# Patient Record
Sex: Female | Born: 1990 | Race: White | Hispanic: No | Marital: Single | Smoking: Former smoker
Health system: Southern US, Community
[De-identification: ages and names within clinical notes are randomized; demographics above are authoritative.]

## PROBLEM LIST (undated history)

## (undated) DIAGNOSIS — R51 Headache: Secondary | ICD-10-CM

## (undated) DIAGNOSIS — F419 Anxiety disorder, unspecified: Secondary | ICD-10-CM

## (undated) DIAGNOSIS — F32A Depression, unspecified: Secondary | ICD-10-CM

## (undated) DIAGNOSIS — N83209 Unspecified ovarian cyst, unspecified side: Secondary | ICD-10-CM

## (undated) DIAGNOSIS — F329 Major depressive disorder, single episode, unspecified: Secondary | ICD-10-CM

## (undated) DIAGNOSIS — R519 Headache, unspecified: Secondary | ICD-10-CM

## (undated) HISTORY — DX: Depression, unspecified: F32.A

## (undated) HISTORY — DX: Major depressive disorder, single episode, unspecified: F32.9

## (undated) HISTORY — PX: LEG SURGERY: SHX1003

---

## 2012-02-19 DIAGNOSIS — L500 Allergic urticaria: Secondary | ICD-10-CM

## 2014-03-04 ENCOUNTER — Emergency Department (HOSPITAL_COMMUNITY): Payer: BC Managed Care – PPO

## 2014-03-04 ENCOUNTER — Encounter (HOSPITAL_COMMUNITY): Payer: Self-pay | Admitting: Radiology

## 2014-03-04 ENCOUNTER — Inpatient Hospital Stay (HOSPITAL_COMMUNITY)
Admission: EM | Admit: 2014-03-04 | Discharge: 2014-03-08 | DRG: 958 | Disposition: A | Discharge status: Rehabilitation Facility | Payer: BC Managed Care – PPO | Attending: Orthopedic Surgery | Admitting: Orthopedic Surgery

## 2014-03-04 ENCOUNTER — Emergency Department (HOSPITAL_COMMUNITY): Payer: BC Managed Care – PPO | Admitting: Anesthesiology

## 2014-03-04 ENCOUNTER — Encounter (HOSPITAL_COMMUNITY): Admission: EM | Disposition: A | Discharge status: Rehabilitation Facility | Payer: Self-pay | Source: Home / Self Care

## 2014-03-04 ENCOUNTER — Encounter (HOSPITAL_COMMUNITY): Payer: BC Managed Care – PPO | Admitting: Anesthesiology

## 2014-03-04 DIAGNOSIS — R209 Unspecified disturbances of skin sensation: Secondary | ICD-10-CM | POA: Diagnosis present

## 2014-03-04 DIAGNOSIS — S71109A Unspecified open wound, unspecified thigh, initial encounter: Secondary | ICD-10-CM | POA: Diagnosis present

## 2014-03-04 DIAGNOSIS — S81809A Unspecified open wound, unspecified lower leg, initial encounter: Secondary | ICD-10-CM

## 2014-03-04 DIAGNOSIS — R339 Retention of urine, unspecified: Secondary | ICD-10-CM | POA: Diagnosis not present

## 2014-03-04 DIAGNOSIS — S81009A Unspecified open wound, unspecified knee, initial encounter: Principal | ICD-10-CM | POA: Diagnosis present

## 2014-03-04 DIAGNOSIS — S060X9A Concussion with loss of consciousness of unspecified duration, initial encounter: Secondary | ICD-10-CM

## 2014-03-04 DIAGNOSIS — S21302A Unspecified open wound of left front wall of thorax with penetration into thoracic cavity, initial encounter: Secondary | ICD-10-CM

## 2014-03-04 DIAGNOSIS — S27329A Contusion of lung, unspecified, initial encounter: Secondary | ICD-10-CM | POA: Diagnosis present

## 2014-03-04 DIAGNOSIS — S3210XA Unspecified fracture of sacrum, initial encounter for closed fracture: Secondary | ICD-10-CM

## 2014-03-04 DIAGNOSIS — S71112A Laceration without foreign body, left thigh, initial encounter: Secondary | ICD-10-CM

## 2014-03-04 DIAGNOSIS — R110 Nausea: Secondary | ICD-10-CM | POA: Diagnosis not present

## 2014-03-04 DIAGNOSIS — S32599A Other specified fracture of unspecified pubis, initial encounter for closed fracture: Secondary | ICD-10-CM

## 2014-03-04 DIAGNOSIS — Z77098 Contact with and (suspected) exposure to other hazardous, chiefly nonmedicinal, chemicals: Secondary | ICD-10-CM | POA: Diagnosis present

## 2014-03-04 DIAGNOSIS — S329XXA Fracture of unspecified parts of lumbosacral spine and pelvis, initial encounter for closed fracture: Secondary | ICD-10-CM

## 2014-03-04 DIAGNOSIS — S322XXA Fracture of coccyx, initial encounter for closed fracture: Secondary | ICD-10-CM

## 2014-03-04 DIAGNOSIS — S060XAA Concussion with loss of consciousness status unknown, initial encounter: Secondary | ICD-10-CM | POA: Diagnosis present

## 2014-03-04 DIAGNOSIS — F1010 Alcohol abuse, uncomplicated: Secondary | ICD-10-CM | POA: Diagnosis present

## 2014-03-04 DIAGNOSIS — S32509A Unspecified fracture of unspecified pubis, initial encounter for closed fracture: Secondary | ICD-10-CM | POA: Diagnosis present

## 2014-03-04 DIAGNOSIS — S91009A Unspecified open wound, unspecified ankle, initial encounter: Principal | ICD-10-CM

## 2014-03-04 DIAGNOSIS — F17200 Nicotine dependence, unspecified, uncomplicated: Secondary | ICD-10-CM | POA: Diagnosis present

## 2014-03-04 DIAGNOSIS — S71012A Laceration without foreign body, left hip, initial encounter: Secondary | ICD-10-CM

## 2014-03-04 DIAGNOSIS — S71009A Unspecified open wound, unspecified hip, initial encounter: Secondary | ICD-10-CM | POA: Diagnosis present

## 2014-03-04 DIAGNOSIS — F10929 Alcohol use, unspecified with intoxication, unspecified: Secondary | ICD-10-CM | POA: Diagnosis present

## 2014-03-04 DIAGNOSIS — S81812A Laceration without foreign body, left lower leg, initial encounter: Secondary | ICD-10-CM

## 2014-03-04 DIAGNOSIS — S32009A Unspecified fracture of unspecified lumbar vertebra, initial encounter for closed fracture: Secondary | ICD-10-CM

## 2014-03-04 HISTORY — PX: I & D EXTREMITY: SHX5045

## 2014-03-04 LAB — COMPREHENSIVE METABOLIC PANEL
ALT: 28 U/L (ref 0–35)
AST: 46 U/L — ABNORMAL HIGH (ref 0–37)
Albumin: 4 g/dL (ref 3.5–5.2)
Alkaline Phosphatase: 74 U/L (ref 39–117)
BUN: 15 mg/dL (ref 6–23)
CALCIUM: 9.7 mg/dL (ref 8.4–10.5)
CO2: 21 meq/L (ref 19–32)
Chloride: 103 mEq/L (ref 96–112)
Creatinine, Ser: 0.76 mg/dL (ref 0.50–1.10)
GFR calc Af Amer: >90 mL/min (ref >90)
GLUCOSE: 99 mg/dL (ref 70–99)
Potassium: 3.7 mEq/L (ref 3.7–5.3)
Sodium: 141 mEq/L (ref 137–147)
TOTAL PROTEIN: 7.2 g/dL (ref 6.0–8.3)
Total Bilirubin: 0.4 mg/dL (ref 0.3–1.2)

## 2014-03-04 LAB — CBC
HCT: 37.6 % (ref 36.0–46.0)
HEMATOCRIT: 40.8 % (ref 36.0–46.0)
HEMOGLOBIN: 12.9 g/dL (ref 12.0–15.0)
Hemoglobin: 13.9 g/dL (ref 12.0–15.0)
MCH: 31 pg (ref 26.0–34.0)
MCH: 31.5 pg (ref 26.0–34.0)
MCHC: 34.1 g/dL (ref 30.0–36.0)
MCHC: 34.3 g/dL (ref 30.0–36.0)
MCV: 90.9 fL (ref 78.0–100.0)
MCV: 91.9 fL (ref 78.0–100.0)
PLATELETS: 200 10*3/uL (ref 150–400)
Platelets: 259 10*3/uL (ref 150–400)
RBC: 4.49 MIL/uL (ref 3.87–5.11)
RBC: 4.09 MIL/uL (ref 3.87–5.11)
RDW: 12.8 % (ref 11.5–15.5)
RDW: 13.1 % (ref 11.5–15.5)
WBC: 20 10*3/uL — AB (ref 4.0–10.5)
WBC: 11.8 10*3/uL — ABNORMAL HIGH (ref 4.0–10.5)

## 2014-03-04 LAB — I-STAT CHEM 8, ED
BUN: 16 mg/dL (ref 6–23)
CALCIUM ION: 1.26 mmol/L — AB (ref 1.12–1.23)
CHLORIDE: 105 meq/L (ref 96–112)
CREATININE: 0.9 mg/dL (ref 0.50–1.10)
Glucose, Bld: 98 mg/dL (ref 70–99)
HEMATOCRIT: 44 % (ref 36.0–46.0)
Hemoglobin: 15 g/dL (ref 12.0–15.0)
POTASSIUM: 3.6 meq/L — AB (ref 3.7–5.3)
Sodium: 142 mEq/L (ref 137–147)
TCO2: 20 mmol/L (ref 0–100)

## 2014-03-04 LAB — URINALYSIS, ROUTINE W REFLEX MICROSCOPIC
BILIRUBIN URINE: NEGATIVE
GLUCOSE, UA: NEGATIVE mg/dL
Ketones, ur: NEGATIVE mg/dL
Leukocytes, UA: NEGATIVE
NITRITE: NEGATIVE
Protein, ur: NEGATIVE mg/dL
SPECIFIC GRAVITY, URINE: 1.035 — AB (ref 1.005–1.030)
Urobilinogen, UA: 0.2 mg/dL (ref 0.0–1.0)
pH: 6 (ref 5.0–8.0)

## 2014-03-04 LAB — SAMPLE TO BLOOD BANK

## 2014-03-04 LAB — CDS SEROLOGY

## 2014-03-04 LAB — URINE MICROSCOPIC-ADD ON

## 2014-03-04 LAB — CREATININE, SERUM
Creatinine, Ser: 0.56 mg/dL (ref 0.50–1.10)
GFR calc Af Amer: >90 mL/min (ref >90)

## 2014-03-04 LAB — PROTIME-INR
INR: 0.89 (ref 0.00–1.49)
Prothrombin Time: 11.9 seconds (ref 11.6–15.2)

## 2014-03-04 LAB — I-STAT CG4 LACTIC ACID, ED: Lactic Acid, Venous: 2.64 mmol/L — ABNORMAL HIGH (ref 0.5–2.2)

## 2014-03-04 LAB — ETHANOL: ALCOHOL ETHYL (B): 79 mg/dL — AB (ref 0–11)

## 2014-03-04 LAB — GENTAMICIN LEVEL, RANDOM: Gentamicin Rm: 1.6 ug/mL

## 2014-03-04 SURGERY — IRRIGATION AND DEBRIDEMENT EXTREMITY
Anesthesia: General | Site: Leg Upper | Laterality: Left

## 2014-03-04 MED ORDER — CEFAZOLIN SODIUM-DEXTROSE 2-3 GM-% IV SOLR
2.0000 g | Freq: Three times a day (TID) | INTRAVENOUS | Status: AC
  Administered 2014-03-04 – 2014-03-05 (×6): 2 g via INTRAVENOUS
  Filled 2014-03-04 (×5): qty 50

## 2014-03-04 MED ORDER — METOCLOPRAMIDE HCL 5 MG/ML IJ SOLN
5.0000 mg | Freq: Three times a day (TID) | INTRAMUSCULAR | Status: DC | PRN
  Administered 2014-03-06: 10 mg via INTRAVENOUS
  Filled 2014-03-04: qty 2

## 2014-03-04 MED ORDER — ONDANSETRON HCL 4 MG/2ML IJ SOLN
4.0000 mg | Freq: Four times a day (QID) | INTRAMUSCULAR | Status: DC | PRN
Start: 2014-03-04 — End: 2014-03-06
  Administered 2014-03-06: 4 mg via INTRAVENOUS
  Filled 2014-03-04: qty 2

## 2014-03-04 MED ORDER — PHENYLEPHRINE 40 MCG/ML (10ML) SYRINGE FOR IV PUSH (FOR BLOOD PRESSURE SUPPORT)
PREFILLED_SYRINGE | INTRAVENOUS | Status: AC
  Filled 2014-03-04: qty 10

## 2014-03-04 MED ORDER — CEFAZOLIN SODIUM-DEXTROSE 2-3 GM-% IV SOLR
2.0000 g | Freq: Once | INTRAVENOUS | Status: AC
  Administered 2014-03-04: 2 g via INTRAVENOUS

## 2014-03-04 MED ORDER — CEFAZOLIN SODIUM-DEXTROSE 2-3 GM-% IV SOLR
INTRAVENOUS | Status: AC
  Filled 2014-03-04: qty 50

## 2014-03-04 MED ORDER — GENTAMICIN SULFATE 40 MG/ML IJ SOLN
INTRAMUSCULAR | Status: DC | PRN
  Administered 2014-03-04: 160 mg

## 2014-03-04 MED ORDER — HYDROMORPHONE HCL PF 1 MG/ML IJ SOLN
INTRAMUSCULAR | Status: AC
  Administered 2014-03-04: 0.5 mg via INTRAVENOUS
  Filled 2014-03-04: qty 1

## 2014-03-04 MED ORDER — ACETAMINOPHEN 500 MG PO TABS
500.0000 mg | ORAL_TABLET | Freq: Four times a day (QID) | ORAL | Status: DC | PRN
  Administered 2014-03-04 – 2014-03-07 (×6): 500 mg via ORAL
  Filled 2014-03-04 (×6): qty 1

## 2014-03-04 MED ORDER — HYDROMORPHONE HCL PF 1 MG/ML IJ SOLN
0.2500 mg | INTRAMUSCULAR | Status: DC | PRN
  Administered 2014-03-04 (×2): 0.5 mg via INTRAVENOUS

## 2014-03-04 MED ORDER — SUCCINYLCHOLINE CHLORIDE 20 MG/ML IJ SOLN
INTRAMUSCULAR | Status: AC
  Filled 2014-03-04: qty 1

## 2014-03-04 MED ORDER — ONDANSETRON HCL 4 MG/2ML IJ SOLN
4.0000 mg | Freq: Once | INTRAMUSCULAR | Status: AC
  Administered 2014-03-04: 4 mg via INTRAVENOUS
  Filled 2014-03-04: qty 2

## 2014-03-04 MED ORDER — LACTATED RINGERS IV SOLN
INTRAVENOUS | Status: DC | PRN
  Administered 2014-03-04 (×3): via INTRAVENOUS

## 2014-03-04 MED ORDER — OXYCODONE HCL 5 MG/5ML PO SOLN: 5.0000 mg | Freq: Once | ORAL | Status: DC | PRN

## 2014-03-04 MED ORDER — ONDANSETRON HCL 4 MG/2ML IJ SOLN: 4.0000 mg | Freq: Once | INTRAMUSCULAR | Status: DC | PRN

## 2014-03-04 MED ORDER — MORPHINE SULFATE 2 MG/ML IJ SOLN
1.0000 mg | INTRAMUSCULAR | Status: DC | PRN
  Administered 2014-03-04 – 2014-03-05 (×5): 1 mg via INTRAVENOUS
  Filled 2014-03-04 (×5): qty 1

## 2014-03-04 MED ORDER — SODIUM CHLORIDE 0.9 % IV SOLN
1000.0000 mL | Freq: Once | INTRAVENOUS | Status: AC
  Administered 2014-03-04: 1000 mL via INTRAVENOUS

## 2014-03-04 MED ORDER — GENTAMICIN SULFATE 40 MG/ML IJ SOLN
INTRAMUSCULAR | Status: AC
  Filled 2014-03-04: qty 4

## 2014-03-04 MED ORDER — MIDAZOLAM HCL 5 MG/5ML IJ SOLN
INTRAMUSCULAR | Status: DC | PRN
  Administered 2014-03-04: 2 mg via INTRAVENOUS

## 2014-03-04 MED ORDER — LIDOCAINE HCL (CARDIAC) 20 MG/ML IV SOLN
INTRAVENOUS | Status: AC
  Filled 2014-03-04: qty 5

## 2014-03-04 MED ORDER — FENTANYL CITRATE 0.05 MG/ML IJ SOLN
INTRAMUSCULAR | Status: DC | PRN
  Administered 2014-03-04: 50 ug via INTRAVENOUS
  Administered 2014-03-04 (×2): 25 ug via INTRAVENOUS
  Administered 2014-03-04 (×6): 50 ug via INTRAVENOUS

## 2014-03-04 MED ORDER — PROPOFOL 10 MG/ML IV BOLUS
INTRAVENOUS | Status: DC | PRN
  Administered 2014-03-04: 200 mg via INTRAVENOUS

## 2014-03-04 MED ORDER — SODIUM CHLORIDE 0.9 % IR SOLN
Status: DC | PRN
Start: 2014-03-04 — End: 2014-03-04
  Administered 2014-03-04: 6000 mL
  Administered 2014-03-04: 3000 mL

## 2014-03-04 MED ORDER — OXYCODONE HCL 5 MG PO TABS
5.0000 mg | ORAL_TABLET | ORAL | Status: DC | PRN
  Administered 2014-03-04 – 2014-03-05 (×3): 10 mg via ORAL
  Filled 2014-03-04 (×3): qty 2

## 2014-03-04 MED ORDER — VANCOMYCIN HCL 500 MG IV SOLR
INTRAVENOUS | Status: AC
Start: 2014-03-04 — End: 2014-03-04
  Filled 2014-03-04: qty 500

## 2014-03-04 MED ORDER — ENOXAPARIN SODIUM 40 MG/0.4ML ~~LOC~~ SOLN
40.0000 mg | SUBCUTANEOUS | Status: DC
  Administered 2014-03-05: 40 mg via SUBCUTANEOUS
  Filled 2014-03-04 (×2): qty 0.4

## 2014-03-04 MED ORDER — LIDOCAINE HCL (CARDIAC) 20 MG/ML IV SOLN
INTRAVENOUS | Status: DC | PRN
  Administered 2014-03-04: 80 mg via INTRAVENOUS

## 2014-03-04 MED ORDER — MIDAZOLAM HCL 2 MG/2ML IJ SOLN
INTRAMUSCULAR | Status: AC
  Filled 2014-03-04: qty 2

## 2014-03-04 MED ORDER — PROPOFOL 10 MG/ML IV BOLUS
INTRAVENOUS | Status: AC
  Filled 2014-03-04: qty 20

## 2014-03-04 MED ORDER — VANCOMYCIN HCL 500 MG IV SOLR
INTRAVENOUS | Status: DC | PRN
  Administered 2014-03-04: 500 mg

## 2014-03-04 MED ORDER — FENTANYL CITRATE 0.05 MG/ML IJ SOLN
INTRAMUSCULAR | Status: AC
  Filled 2014-03-04: qty 5

## 2014-03-04 MED ORDER — METOCLOPRAMIDE HCL 10 MG PO TABS
5.0000 mg | ORAL_TABLET | Freq: Three times a day (TID) | ORAL | Status: DC | PRN
  Administered 2014-03-05: 10 mg via ORAL
  Filled 2014-03-04: qty 1

## 2014-03-04 MED ORDER — ARTIFICIAL TEARS OP OINT
TOPICAL_OINTMENT | OPHTHALMIC | Status: AC
  Filled 2014-03-04: qty 3.5

## 2014-03-04 MED ORDER — MORPHINE SULFATE 4 MG/ML IJ SOLN
4.0000 mg | INTRAMUSCULAR | Status: DC | PRN
  Administered 2014-03-04: 4 mg via INTRAVENOUS
  Filled 2014-03-04: qty 1

## 2014-03-04 MED ORDER — OXYCODONE HCL 5 MG PO TABS: 5.0000 mg | ORAL_TABLET | Freq: Once | ORAL | Status: DC | PRN

## 2014-03-04 MED ORDER — SODIUM CHLORIDE 0.9 % IV SOLN
1000.0000 mL | INTRAVENOUS | Status: DC
  Administered 2014-03-04: 1000 mL via INTRAVENOUS

## 2014-03-04 MED ORDER — GENTAMICIN SULFATE 40 MG/ML IJ SOLN
7.0000 mg/kg | INTRAMUSCULAR | Status: DC
  Administered 2014-03-04 – 2014-03-06 (×3): 430 mg via INTRAVENOUS
  Filled 2014-03-04 (×4): qty 10.75

## 2014-03-04 MED ORDER — ONDANSETRON HCL 4 MG/2ML IJ SOLN
INTRAMUSCULAR | Status: DC | PRN
  Administered 2014-03-04: 4 mg via INTRAVENOUS

## 2014-03-04 MED ORDER — MORPHINE SULFATE 4 MG/ML IJ SOLN
6.0000 mg | Freq: Once | INTRAMUSCULAR | Status: AC
  Administered 2014-03-04: 6 mg via INTRAVENOUS
  Filled 2014-03-04: qty 2

## 2014-03-04 MED ORDER — METRONIDAZOLE IN NACL 5-0.79 MG/ML-% IV SOLN
500.0000 mg | Freq: Three times a day (TID) | INTRAVENOUS | Status: AC
  Administered 2014-03-04 – 2014-03-05 (×5): 500 mg via INTRAVENOUS
  Filled 2014-03-04 (×5): qty 100

## 2014-03-04 MED ORDER — SUCCINYLCHOLINE CHLORIDE 20 MG/ML IJ SOLN
INTRAMUSCULAR | Status: DC | PRN
  Administered 2014-03-04: 120 mg via INTRAVENOUS

## 2014-03-04 MED ORDER — IOHEXOL 300 MG/ML  SOLN
100.0000 mL | Freq: Once | INTRAMUSCULAR | Status: AC | PRN
  Administered 2014-03-04: 100 mL via INTRAVENOUS

## 2014-03-04 MED ORDER — ONDANSETRON HCL 4 MG PO TABS
4.0000 mg | ORAL_TABLET | Freq: Four times a day (QID) | ORAL | Status: DC | PRN
  Administered 2014-03-05: 4 mg via ORAL
  Filled 2014-03-04: qty 1

## 2014-03-04 MED ORDER — ONDANSETRON HCL 4 MG/2ML IJ SOLN
INTRAMUSCULAR | Status: AC
  Filled 2014-03-04: qty 2

## 2014-03-04 SURGICAL SUPPLY — 53 items
BANDAGE ELASTIC 4 VELCRO ST LF (GAUZE/BANDAGES/DRESSINGS) IMPLANT
BANDAGE GAUZE ELAST BULKY 4 IN (GAUZE/BANDAGES/DRESSINGS) IMPLANT
BNDG COHESIVE 4X5 TAN STRL (GAUZE/BANDAGES/DRESSINGS) IMPLANT
COVER SURGICAL LIGHT HANDLE (MISCELLANEOUS) ×3 IMPLANT
CUFF TOURNIQUET SINGLE 18IN (TOURNIQUET CUFF) ×3 IMPLANT
CUFF TOURNIQUET SINGLE 24IN (TOURNIQUET CUFF) IMPLANT
CUFF TOURNIQUET SINGLE 34IN LL (TOURNIQUET CUFF) IMPLANT
CUFF TOURNIQUET SINGLE 44IN (TOURNIQUET CUFF) IMPLANT
DRAPE U-SHAPE 47X51 STRL (DRAPES) ×3 IMPLANT
DRSG PAD ABDOMINAL 8X10 ST (GAUZE/BANDAGES/DRESSINGS) IMPLANT
DRSG VAC ATS SM SENSATRAC (GAUZE/BANDAGES/DRESSINGS) ×6 IMPLANT
DURAPREP 26ML APPLICATOR (WOUND CARE) ×3 IMPLANT
ELECT REM PT RETURN 9FT ADLT (ELECTROSURGICAL)
ELECTRODE REM PT RTRN 9FT ADLT (ELECTROSURGICAL) IMPLANT
FACESHIELD WRAPAROUND (MASK) ×3 IMPLANT
GAUZE XEROFORM 5X9 LF (GAUZE/BANDAGES/DRESSINGS) IMPLANT
GLOVE BIOGEL PI IND STRL 8 (GLOVE) ×1 IMPLANT
GLOVE BIOGEL PI INDICATOR 8 (GLOVE) ×2
GLOVE SURG ORTHO 8.0 STRL STRW (GLOVE) ×3 IMPLANT
GOWN STRL REUS W/ TWL LRG LVL3 (GOWN DISPOSABLE) ×2 IMPLANT
GOWN STRL REUS W/ TWL XL LVL3 (GOWN DISPOSABLE) ×1 IMPLANT
GOWN STRL REUS W/TWL LRG LVL3 (GOWN DISPOSABLE) ×4
GOWN STRL REUS W/TWL XL LVL3 (GOWN DISPOSABLE) ×2
HANDPIECE INTERPULSE COAX TIP (DISPOSABLE)
KIT BASIN OR (CUSTOM PROCEDURE TRAY) ×3 IMPLANT
KIT ROOM TURNOVER OR (KITS) ×3 IMPLANT
KIT STIMULAN RAPID CURE  10CC (Orthopedic Implant) ×2 IMPLANT
KIT STIMULAN RAPID CURE 10CC (Orthopedic Implant) ×1 IMPLANT
MANIFOLD NEPTUNE II (INSTRUMENTS) ×3 IMPLANT
NS IRRIG 1000ML POUR BTL (IV SOLUTION) ×3 IMPLANT
PACK ORTHO EXTREMITY (CUSTOM PROCEDURE TRAY) ×3 IMPLANT
PAD ARMBOARD 7.5X6 YLW CONV (MISCELLANEOUS) ×6 IMPLANT
PAD CAST 4YDX4 CTTN HI CHSV (CAST SUPPLIES) IMPLANT
PADDING CAST COTTON 4X4 STRL (CAST SUPPLIES)
SET HNDPC FAN SPRY TIP SCT (DISPOSABLE) IMPLANT
SPONGE GAUZE 4X4 12PLY (GAUZE/BANDAGES/DRESSINGS) IMPLANT
SPONGE LAP 18X18 X RAY DECT (DISPOSABLE) ×9 IMPLANT
SPONGE LAP 4X18 X RAY DECT (DISPOSABLE) ×3 IMPLANT
STOCKINETTE IMPERVIOUS 9X36 MD (GAUZE/BANDAGES/DRESSINGS) ×3 IMPLANT
SUT ETHILON 2 0 FS 18 (SUTURE) IMPLANT
SUT ETHILON 3 0 PS 1 (SUTURE) IMPLANT
SUT ETHILON 4 0 PS 2 18 (SUTURE) IMPLANT
SUT PROLENE 3 0 PS 2 (SUTURE) IMPLANT
SUT VIC AB 3-0 SH 27 (SUTURE)
SUT VIC AB 3-0 SH 27X BRD (SUTURE) IMPLANT
TOWEL OR 17X24 6PK STRL BLUE (TOWEL DISPOSABLE) ×3 IMPLANT
TOWEL OR 17X26 10 PK STRL BLUE (TOWEL DISPOSABLE) ×3 IMPLANT
TUBE ANAEROBIC SPECIMEN COL (MISCELLANEOUS) IMPLANT
TUBE CONNECTING 12'X1/4 (SUCTIONS) ×1
TUBE CONNECTING 12X1/4 (SUCTIONS) ×2 IMPLANT
UNDERPAD 30X30 INCONTINENT (UNDERPADS AND DIAPERS) ×3 IMPLANT
WATER STERILE IRR 1000ML POUR (IV SOLUTION) ×3 IMPLANT
YANKAUER SUCT BULB TIP NO VENT (SUCTIONS) ×3 IMPLANT

## 2014-03-04 NOTE — Progress Notes (Signed)
Review of radiological studies with the radiologist demonstrates the patient does have L1 transverse process fracture no rib fractures L-spine otherwise intact cervical spine shows no fractures she does have minimally displaced left-sided sacral fracture through the left S1 and S2 as well as a superior rami fracture on the left-hand side. Otherwise chest abdomen pelvis CT is negative for intra-abdominal injury. The patient will be covered with additional gentamicin and Flagyl. Plan is for operative debridement of her open wounds later today. No fracture present in the lower extremities.

## 2014-03-04 NOTE — Transfer of Care (Signed)
Immediate Anesthesia Transfer of Care Note  Patient: Valerie AgeeKendra L Rose  Procedure(s) Performed: Procedure(s) with comments: IRRIGATION AND DEBRIDEMENT EXTREMITY, REPAIR OF MULTIPLE LACERATIONS, HIP AND KNEE (Left) - Wounds located at left hip and knee.  Patient Location: PACU  Anesthesia Type:General  Level of Consciousness: awake  Airway & Oxygen Therapy: Patient Spontanous Breathing and Patient connected to nasal cannula oxygen  Post-op Assessment: Report given to PACU RN, Post -op Vital signs reviewed and stable and Patient moving all extremities  Post vital signs: Reviewed and stable  Complications: No apparent anesthesia complications
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
## 2014-03-04 NOTE — Anesthesia Preprocedure Evaluation (Signed)
Anesthesia Evaluation  Patient identified by MRN, date of birth, ID band Patient awake    Reviewed: Allergy & Precautions, H&P , NPO status , Patient's Chart, lab work & pertinent test results  Airway Mallampati: II TM Distance: >3 FB Neck ROM: Limited  Mouth opening: Limited Mouth Opening  Dental  (+) Teeth Intact, Dental Advisory Given   Pulmonary Current Smoker,  breath sounds clear to auscultation        Cardiovascular Rhythm:Regular Rate:Normal     Neuro/Psych    GI/Hepatic   Endo/Other    Renal/GU      Musculoskeletal   Abdominal   Peds  Hematology   Anesthesia Other Findings Pt c/o Left TMJ area pain on limited mouth opening.  Reproductive/Obstetrics                           Anesthesia Physical Anesthesia Plan  ASA: I and emergent  Anesthesia Plan:    Post-op Pain Management:    Induction: Intravenous, Rapid sequence and Cricoid pressure planned  Airway Management Planned: Oral ETT  Additional Equipment:   Intra-op Plan:   Post-operative Plan: Extubation in OR  Informed Consent: I have reviewed the patients History and Physical, chart, labs and discussed the procedure including the risks, benefits and alternatives for the proposed anesthesia with the patient or authorized representative who has indicated his/her understanding and acceptance.   Dental advisory given  Plan Discussed with: CRNA, Anesthesiologist and Surgeon  Anesthesia Plan Comments:         Anesthesia Quick Evaluation

## 2014-03-04 NOTE — Progress Notes (Signed)
Patient has complaint of consistent headaches throughout the day. Patient states that headaches are aggravated by pain medication and when eating or opening her mouth. Patient requests Tylenol for pain management. [facility] Dr. August Saucerean returned the call and ordered RN to give patient Tylenol 500 mg 4 times a day as needed. RN will continue to assess patients pain and comfort level.
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
## 2014-03-04 NOTE — Anesthesia Postprocedure Evaluation (Signed)
  Anesthesia Post-op Note  Patient: Valerie Rose  Procedure(s) Performed: Procedure(s) with comments: IRRIGATION AND DEBRIDEMENT EXTREMITY, REPAIR OF MULTIPLE LACERATIONS, HIP AND KNEE (Left) - Wounds located at left hip and knee.  Patient Location: PACU  Anesthesia Type:General  Level of Consciousness: awake  Airway and Oxygen Therapy: Patient Spontanous Breathing and Patient connected to nasal cannula oxygen  Post-op Pain: mild  Post-op Assessment: Post-op Vital signs reviewed  Post-op Vital Signs: Reviewed  Last Vitals:  Filed Vitals:   03/04/14 1114  BP: 112/58  Pulse: 67  Temp:   Resp: 11    Complications: No apparent anesthesia complications
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
## 2014-03-04 NOTE — ED Notes (Signed)
Pt transported to CT ?

## 2014-03-04 NOTE — ED Notes (Signed)
[place] collar placed per v.o. Dr Andrey CampanileWilson.  Pt tolerated well.  Awaiting move to OR.

## 2014-03-04 NOTE — Anesthesia Procedure Notes (Signed)
Procedure Name: Intubation Date/Time: 03/04/2014 8:33 AM Performed by: Luster LandsbergHASE, Valerie Shaddix R Pre-anesthesia Checklist: Patient identified, Emergency Drugs available, Suction available and Patient being monitored Patient Re-evaluated:Patient Re-evaluated prior to inductionOxygen Delivery Method: Circle system utilized Preoxygenation: Pre-oxygenation with 100% oxygen Intubation Type: IV induction, Rapid sequence and Cricoid Pressure applied Laryngoscope Size: Mac and 3 Grade View: Grade I Tube type: Oral Tube size: 7.0 mm Number of attempts: 1 Airway Equipment and Method: Stylet Placement Confirmation: ETT inserted through vocal cords under direct vision,  positive ETCO2 and breath sounds checked- equal and bilateral Secured at: 20 cm Tube secured with: Tape Dental Injury: Teeth and Oropharynx as per pre-operative assessment  Comments: Front of C-collar removed for cricoid pressure for DVL. Neck precautions maintained throughout.  Front of collar replaced after intubation.
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
## 2014-03-04 NOTE — ED Provider Notes (Signed)
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  gentamicin (GARAMYCIN) injection (160 mg Other Given 03/04/14 0912)  morphine 4 MG/ML injection 6 mg (6 mg Intravenous Given 03/04/14 0212)  ceFAZolin (ANCEF) IVPB 2 g/50 mL premix (0 g Intravenous Stopped 03/04/14 0245)  iohexol (OMNIPAQUE) 300 MG/ML solution 100 mL (100 mLs Intravenous Contrast Given 03/04/14 0223)  ondansetron (ZOFRAN) injection 4 mg (4 mg Intravenous Given 03/04/14 0618)     Filed Vitals:   03/04/14 0630 03/04/14 0700 03/04/14 0730 03/04/14 0739  BP: 113/59 110/63 110/57   Pulse: 85 79  82  Temp:      TempSrc:      Resp:      Height:      Weight:      SpO2: 99% 100% 100% 100%    Admission/ observation were discussed with the admitting physician, patient and/or family and they are comfortable with the plan.      Enid Skeens, MD 03/04/14 [phone]
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
## 2014-03-04 NOTE — Progress Notes (Signed)
Orthopedic Tech Progress Note Patient Details:  Karlyn AgeeKendra L Polidore May 28, 1991 [id number]  Responded to trauma page.   Patient ID: Karlyn AgeeKendra L Ford, female   DOB: May 28, 1991, 23 y.o.   MRN: [id number]   Early CharsBaker,Alvera Tourigny Anthony 03/04/2014, 2:06 AM

## 2014-03-05 ENCOUNTER — Inpatient Hospital Stay (HOSPITAL_COMMUNITY): Payer: BC Managed Care – PPO

## 2014-03-05 ENCOUNTER — Other Ambulatory Visit (HOSPITAL_COMMUNITY): Payer: Self-pay | Admitting: Orthopedic Surgery

## 2014-03-05 ENCOUNTER — Encounter (HOSPITAL_COMMUNITY): Payer: Self-pay

## 2014-03-05 DIAGNOSIS — S060X9A Concussion with loss of consciousness of unspecified duration, initial encounter: Secondary | ICD-10-CM

## 2014-03-05 DIAGNOSIS — S71012A Laceration without foreign body, left hip, initial encounter: Secondary | ICD-10-CM

## 2014-03-05 DIAGNOSIS — S81812A Laceration without foreign body, left lower leg, initial encounter: Secondary | ICD-10-CM

## 2014-03-05 DIAGNOSIS — S060XAA Concussion with loss of consciousness status unknown, initial encounter: Secondary | ICD-10-CM

## 2014-03-05 DIAGNOSIS — S32599A Other specified fracture of unspecified pubis, initial encounter for closed fracture: Secondary | ICD-10-CM

## 2014-03-05 MED ORDER — MORPHINE SULFATE 2 MG/ML IJ SOLN
2.0000 mg | INTRAMUSCULAR | Status: DC | PRN
  Administered 2014-03-05 – 2014-03-07 (×5): 2 mg via INTRAVENOUS
  Filled 2014-03-05 (×5): qty 1

## 2014-03-05 MED ORDER — POLYETHYLENE GLYCOL 3350 17 G PO PACK
17.0000 g | PACK | Freq: Every day | ORAL | Status: DC
Start: 2014-03-05 — End: 2014-03-08
  Administered 2014-03-07 – 2014-03-08 (×2): 17 g via ORAL
  Filled 2014-03-05 (×4): qty 1

## 2014-03-05 MED ORDER — DOCUSATE SODIUM 100 MG PO CAPS
100.0000 mg | ORAL_CAPSULE | Freq: Two times a day (BID) | ORAL | Status: DC
  Administered 2014-03-05 – 2014-03-08 (×5): 100 mg via ORAL
  Filled 2014-03-05 (×6): qty 1

## 2014-03-05 MED ORDER — TRAMADOL HCL 50 MG PO TABS
100.0000 mg | ORAL_TABLET | Freq: Four times a day (QID) | ORAL | Status: DC
  Administered 2014-03-05 – 2014-03-06 (×4): 100 mg via ORAL
  Filled 2014-03-05 (×4): qty 2

## 2014-03-05 MED ORDER — NAPROXEN 500 MG PO TABS
500.0000 mg | ORAL_TABLET | Freq: Two times a day (BID) | ORAL | Status: DC
  Administered 2014-03-05 – 2014-03-08 (×5): 500 mg via ORAL
  Filled 2014-03-05 (×9): qty 1

## 2014-03-05 MED ORDER — HYDROCODONE-ACETAMINOPHEN 10-325 MG PO TABS
0.5000 | ORAL_TABLET | ORAL | Status: DC | PRN
  Administered 2014-03-05: 2 via ORAL
  Administered 2014-03-05: 1 via ORAL
  Administered 2014-03-06 (×2): 2 via ORAL
  Filled 2014-03-05: qty 2
  Filled 2014-03-05: qty 1
  Filled 2014-03-05 (×2): qty 2

## 2014-03-05 NOTE — Progress Notes (Signed)
Inpatient rehab being recommended by PT.  Patient slow to go.  Lots of nausea with movement.  VAC okay laterally, patient though she had disrupted the VAC with getting back into bed.  Pressure still -75, no evidence of leak.  This patient has been seen and I agree with the findings and treatment plan.  Marta LamasJames O. Gae BonWyatt, III, MD, FACS [phone] (pager) [phone] (direct pager) Trauma Surgeon

## 2014-03-05 NOTE — Progress Notes (Signed)
OT Cancellation Note  Patient Details Name: Karlyn AgeeKendra L Melena MRN: [id number] DOB: 28-Mar-1991   Cancelled Treatment:    Reason Eval/Treat Not Completed: Patient at procedure or test/ unavailable, will check back later as time allows.  Maurene CapesKeene, Whitney OTS [phone] 03/05/2014, 11:11 AM

## 2014-03-05 NOTE — Progress Notes (Signed)
I have read and agree with this note.   Cathy Leonard, OTR/L [phone]     

## 2014-03-05 NOTE — Evaluation (Signed)
I have read and agree with this note.   Time in/out: [id number]  Total time: (21 minutes--Ev and 1SC)  Ignacia Palmaathy Leonard, OTR/L [phone]
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
## 2014-03-05 NOTE — Progress Notes (Signed)
Rehab Admissions Coordinator Note:  Patient was screened by Clois DupesBoyette, Barbara Godwin for appropriateness for an Inpatient Acute Rehab Consult per PT recommendation.   At this time, we are recommending Inpatient Rehab consult. I will contact Trauma PA.  Clois DupesBoyette, Barbara Godwin 03/05/2014, 1:14 PM  I can be reached at [phone].

## 2014-03-05 NOTE — Progress Notes (Signed)
Pt stable - pain ok vss vacs working On triple abx Plan or pm tues Dc wed Hold lovenox am Npo after midnight
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
## 2014-03-05 NOTE — Progress Notes (Signed)
UR completed.  Aisea Bouldin, RN BSN MHA CCM Trauma/Neuro ICU Case Manager [phone]  
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
## 2014-03-05 NOTE — Consult Note (Signed)
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 the context of the above medical diagnosis(es)? Yes Addressing deficits in the following areas: balance, endurance, locomotion, strength, transferring, bowel/bladder control, bathing, dressing, feeding, toileting and cognition 5. Can the patient actively participate in an intensive therapy program of at least 3 hrs of therapy per day at least 5 days per week? Potentially 6. The potential for patient to make measurable gains while on inpatient rehab is excellent 7. Anticipated functional outcomes upon discharge from inpatient rehab are modified independent with PT, modified independent with OT, modified independent with SLP. 8. Estimated rehab length of stay to reach the above functional goals is: 7-10 days 9. Does the patient have adequate social supports to accommodate these discharge functional goals? Yes 10. Anticipated D/C setting: Home 11. Anticipated post D/C treatments: HH therapy 12. Overall Rehab/Functional Prognosis: good RECOMMENDATIONS:  This patient's condition is appropriate for continued rehabilitative care in the following setting: CIR  Patient has agreed to participate in recommended program. Yes  Note that insurance prior authorization may be required for reimbursement for recommended care.  Comment: per Ortho is ready for  Rehab, NWB LLE  03/05/2014     

## 2014-03-06 ENCOUNTER — Encounter (HOSPITAL_COMMUNITY): Payer: BC Managed Care – PPO | Admitting: Anesthesiology

## 2014-03-06 ENCOUNTER — Inpatient Hospital Stay (HOSPITAL_COMMUNITY): Payer: BC Managed Care – PPO | Admitting: Anesthesiology

## 2014-03-06 ENCOUNTER — Encounter (HOSPITAL_COMMUNITY): Payer: Self-pay | Admitting: Anesthesiology

## 2014-03-06 ENCOUNTER — Encounter (HOSPITAL_COMMUNITY): Admission: EM | Disposition: A | Discharge status: Rehabilitation Facility | Payer: Self-pay | Source: Home / Self Care

## 2014-03-06 DIAGNOSIS — F10929 Alcohol use, unspecified with intoxication, unspecified: Secondary | ICD-10-CM | POA: Diagnosis present

## 2014-03-06 DIAGNOSIS — R339 Retention of urine, unspecified: Secondary | ICD-10-CM

## 2014-03-06 DIAGNOSIS — D62 Acute posthemorrhagic anemia: Secondary | ICD-10-CM

## 2014-03-06 HISTORY — PX: I & D EXTREMITY: SHX5045

## 2014-03-06 LAB — CREATININE, SERUM: Creatinine, Ser: 0.54 mg/dL (ref 0.50–1.10)

## 2014-03-06 LAB — CBC
HCT: 29.9 % — ABNORMAL LOW (ref 36.0–46.0)
Hemoglobin: 10 g/dL — ABNORMAL LOW (ref 12.0–15.0)
MCH: 31 pg (ref 26.0–34.0)
MCHC: 33.4 g/dL (ref 30.0–36.0)
MCV: 92.6 fL (ref 78.0–100.0)
PLATELETS: 133 10*3/uL — AB (ref 150–400)
RBC: 3.23 MIL/uL — ABNORMAL LOW (ref 3.87–5.11)
RDW: 12.6 % (ref 11.5–15.5)
WBC: 8.4 10*3/uL (ref 4.0–10.5)

## 2014-03-06 SURGERY — IRRIGATION AND DEBRIDEMENT EXTREMITY
Anesthesia: Choice | Site: Leg Upper | Laterality: Left

## 2014-03-06 MED ORDER — METOCLOPRAMIDE HCL 10 MG PO TABS: 5.0000 mg | ORAL_TABLET | Freq: Three times a day (TID) | ORAL | Status: DC | PRN

## 2014-03-06 MED ORDER — FENTANYL CITRATE 0.05 MG/ML IJ SOLN
INTRAMUSCULAR | Status: AC
  Filled 2014-03-06: qty 5

## 2014-03-06 MED ORDER — PROPOFOL 10 MG/ML IV BOLUS
INTRAVENOUS | Status: DC | PRN
  Administered 2014-03-06: 170 mg via INTRAVENOUS

## 2014-03-06 MED ORDER — OXYCODONE HCL 5 MG/5ML PO SOLN: 5.0000 mg | Freq: Once | ORAL | Status: DC | PRN

## 2014-03-06 MED ORDER — ONDANSETRON HCL 4 MG PO TABS: 4.0000 mg | ORAL_TABLET | Freq: Four times a day (QID) | ORAL | Status: DC | PRN

## 2014-03-06 MED ORDER — HYDROMORPHONE HCL PF 1 MG/ML IJ SOLN
INTRAMUSCULAR | Status: AC
  Filled 2014-03-06: qty 2

## 2014-03-06 MED ORDER — VANCOMYCIN HCL 500 MG IV SOLR
INTRAVENOUS | Status: AC
  Filled 2014-03-06: qty 500

## 2014-03-06 MED ORDER — MIDAZOLAM HCL 2 MG/2ML IJ SOLN
INTRAMUSCULAR | Status: AC
  Filled 2014-03-06: qty 2

## 2014-03-06 MED ORDER — ONDANSETRON HCL 4 MG/2ML IJ SOLN: 4.0000 mg | Freq: Once | INTRAMUSCULAR | Status: DC | PRN

## 2014-03-06 MED ORDER — CEFAZOLIN SODIUM-DEXTROSE 2-3 GM-% IV SOLR
INTRAVENOUS | Status: DC | PRN
  Administered 2014-03-06: 2 g via INTRAVENOUS

## 2014-03-06 MED ORDER — OXYCODONE HCL 5 MG PO TABS
10.0000 mg | ORAL_TABLET | ORAL | Status: DC | PRN
  Administered 2014-03-06: 20 mg via ORAL
  Administered 2014-03-07 (×3): 10 mg via ORAL
  Administered 2014-03-07 (×2): 20 mg via ORAL
  Administered 2014-03-08: 10 mg via ORAL
  Filled 2014-03-06 (×2): qty 4
  Filled 2014-03-06 (×2): qty 2
  Filled 2014-03-06: qty 4
  Filled 2014-03-06: qty 2
  Filled 2014-03-06: qty 4

## 2014-03-06 MED ORDER — DEXTROSE 5 % IV SOLN
INTRAVENOUS | Status: DC | PRN
  Administered 2014-03-06: 16:00:00 via INTRAVENOUS

## 2014-03-06 MED ORDER — ENOXAPARIN SODIUM 40 MG/0.4ML ~~LOC~~ SOLN
40.0000 mg | SUBCUTANEOUS | Status: DC
  Administered 2014-03-07 – 2014-03-08 (×2): 40 mg via SUBCUTANEOUS
  Filled 2014-03-06 (×3): qty 0.4

## 2014-03-06 MED ORDER — GENTAMICIN SULFATE 40 MG/ML IJ SOLN
INTRAMUSCULAR | Status: DC | PRN
  Administered 2014-03-06: 120 mg

## 2014-03-06 MED ORDER — FENTANYL CITRATE 0.05 MG/ML IJ SOLN
INTRAMUSCULAR | Status: DC | PRN
  Administered 2014-03-06 (×3): 50 ug via INTRAVENOUS

## 2014-03-06 MED ORDER — METOCLOPRAMIDE HCL 5 MG/ML IJ SOLN: 5.0000 mg | Freq: Three times a day (TID) | INTRAMUSCULAR | Status: DC | PRN

## 2014-03-06 MED ORDER — HYDROMORPHONE HCL PF 1 MG/ML IJ SOLN
0.2500 mg | INTRAMUSCULAR | Status: DC | PRN
  Administered 2014-03-06 (×2): 0.5 mg via INTRAVENOUS

## 2014-03-06 MED ORDER — PROPOFOL 10 MG/ML IV BOLUS
INTRAVENOUS | Status: AC
  Filled 2014-03-06: qty 20

## 2014-03-06 MED ORDER — LIDOCAINE HCL (CARDIAC) 20 MG/ML IV SOLN
INTRAVENOUS | Status: AC
  Filled 2014-03-06: qty 5

## 2014-03-06 MED ORDER — MIDAZOLAM HCL 5 MG/5ML IJ SOLN
INTRAMUSCULAR | Status: DC | PRN
  Administered 2014-03-06 (×2): 1 mg via INTRAVENOUS

## 2014-03-06 MED ORDER — ONDANSETRON HCL 4 MG/2ML IJ SOLN
INTRAMUSCULAR | Status: AC
  Filled 2014-03-06: qty 2

## 2014-03-06 MED ORDER — LACTATED RINGERS IV SOLN
INTRAVENOUS | Status: DC | PRN
  Administered 2014-03-06 (×2): via INTRAVENOUS

## 2014-03-06 MED ORDER — LIDOCAINE HCL (CARDIAC) 20 MG/ML IV SOLN
INTRAVENOUS | Status: DC | PRN
Start: 2014-03-06 — End: 2014-03-06
  Administered 2014-03-06: 100 mg via INTRAVENOUS

## 2014-03-06 MED ORDER — GENTAMICIN SULFATE 40 MG/ML IJ SOLN
INTRAMUSCULAR | Status: AC
  Filled 2014-03-06: qty 4

## 2014-03-06 MED ORDER — ONDANSETRON HCL 4 MG/2ML IJ SOLN
4.0000 mg | Freq: Four times a day (QID) | INTRAMUSCULAR | Status: DC | PRN
  Administered 2014-03-06 – 2014-03-07 (×2): 4 mg via INTRAVENOUS
  Filled 2014-03-06: qty 2

## 2014-03-06 MED ORDER — PHENYLEPHRINE HCL 10 MG/ML IJ SOLN
INTRAMUSCULAR | Status: DC | PRN
  Administered 2014-03-06 (×3): 40 ug via INTRAVENOUS

## 2014-03-06 MED ORDER — OXYCODONE HCL 5 MG PO TABS
5.0000 mg | ORAL_TABLET | Freq: Once | ORAL | Status: DC | PRN
Start: 2014-03-06 — End: 2014-03-06

## 2014-03-06 MED ORDER — MEPERIDINE HCL 25 MG/ML IJ SOLN: 6.2500 mg | INTRAMUSCULAR | Status: DC | PRN

## 2014-03-06 MED ORDER — VANCOMYCIN HCL 500 MG IV SOLR
INTRAVENOUS | Status: DC | PRN
  Administered 2014-03-06: 500 mg

## 2014-03-06 MED ORDER — PHENYLEPHRINE 40 MCG/ML (10ML) SYRINGE FOR IV PUSH (FOR BLOOD PRESSURE SUPPORT)
PREFILLED_SYRINGE | INTRAVENOUS | Status: AC
  Filled 2014-03-06: qty 10

## 2014-03-06 MED ORDER — ONDANSETRON HCL 4 MG/2ML IJ SOLN
INTRAMUSCULAR | Status: DC | PRN
Start: 2014-03-06 — End: 2014-03-06
  Administered 2014-03-06: 4 mg via INTRAVENOUS

## 2014-03-06 MED ORDER — POTASSIUM CHLORIDE IN NACL 20-0.9 MEQ/L-% IV SOLN
INTRAVENOUS | Status: DC
  Administered 2014-03-06: 22:00:00 via INTRAVENOUS
  Filled 2014-03-06 (×2): qty 1000

## 2014-03-06 MED ORDER — BETHANECHOL CHLORIDE 25 MG PO TABS
25.0000 mg | ORAL_TABLET | Freq: Four times a day (QID) | ORAL | Status: DC
  Administered 2014-03-07 (×5): 25 mg via ORAL
  Filled 2014-03-06 (×12): qty 1

## 2014-03-06 MED ORDER — KCL IN DEXTROSE-NACL 20-5-0.45 MEQ/L-%-% IV SOLN
INTRAVENOUS | Status: AC
  Filled 2014-03-06: qty 1000

## 2014-03-06 MED ORDER — CEFAZOLIN SODIUM-DEXTROSE 2-3 GM-% IV SOLR
INTRAVENOUS | Status: AC
  Filled 2014-03-06: qty 50

## 2014-03-06 MED ORDER — SODIUM CHLORIDE 0.9 % IR SOLN
Status: DC | PRN
  Administered 2014-03-06: 5000 mL

## 2014-03-06 MED ORDER — ARTIFICIAL TEARS OP OINT
TOPICAL_OINTMENT | OPHTHALMIC | Status: DC | PRN
Start: 2014-03-06 — End: 2014-03-06
  Administered 2014-03-06: 1 via OPHTHALMIC

## 2014-03-06 SURGICAL SUPPLY — 58 items
BANDAGE ELASTIC 4 VELCRO ST LF (GAUZE/BANDAGES/DRESSINGS) IMPLANT
BANDAGE ELASTIC 6 VELCRO ST LF (GAUZE/BANDAGES/DRESSINGS) ×3 IMPLANT
BANDAGE GAUZE ELAST BULKY 4 IN (GAUZE/BANDAGES/DRESSINGS) IMPLANT
BNDG COHESIVE 4X5 TAN STRL (GAUZE/BANDAGES/DRESSINGS) ×3 IMPLANT
COVER SURGICAL LIGHT HANDLE (MISCELLANEOUS) ×3 IMPLANT
CUFF TOURNIQUET SINGLE 18IN (TOURNIQUET CUFF) ×3 IMPLANT
CUFF TOURNIQUET SINGLE 24IN (TOURNIQUET CUFF) IMPLANT
CUFF TOURNIQUET SINGLE 34IN LL (TOURNIQUET CUFF) IMPLANT
CUFF TOURNIQUET SINGLE 44IN (TOURNIQUET CUFF) IMPLANT
DRAPE SURG 17X23 STRL (DRAPES) ×3 IMPLANT
DRAPE U-SHAPE 47X51 STRL (DRAPES) ×3 IMPLANT
DRSG ADAPTIC 3X8 NADH LF (GAUZE/BANDAGES/DRESSINGS) ×3 IMPLANT
DRSG MEPILEX BORDER 4X8 (GAUZE/BANDAGES/DRESSINGS) ×12 IMPLANT
DRSG PAD ABDOMINAL 8X10 ST (GAUZE/BANDAGES/DRESSINGS) ×3 IMPLANT
DURAPREP 26ML APPLICATOR (WOUND CARE) ×3 IMPLANT
ELECT REM PT RETURN 9FT ADLT (ELECTROSURGICAL)
ELECTRODE REM PT RTRN 9FT ADLT (ELECTROSURGICAL) IMPLANT
FACESHIELD WRAPAROUND (MASK) ×3 IMPLANT
GAUZE XEROFORM 5X9 LF (GAUZE/BANDAGES/DRESSINGS) ×3 IMPLANT
GLOVE BIOGEL PI IND STRL 8 (GLOVE) ×1 IMPLANT
GLOVE BIOGEL PI INDICATOR 8 (GLOVE) ×2
GLOVE ECLIPSE 6.5 STRL STRAW (GLOVE) ×3 IMPLANT
GLOVE SURG ORTHO 8.0 STRL STRW (GLOVE) ×3 IMPLANT
GOWN STRL REUS W/ TWL LRG LVL3 (GOWN DISPOSABLE) ×2 IMPLANT
GOWN STRL REUS W/ TWL XL LVL3 (GOWN DISPOSABLE) ×1 IMPLANT
GOWN STRL REUS W/TWL LRG LVL3 (GOWN DISPOSABLE) ×4
GOWN STRL REUS W/TWL XL LVL3 (GOWN DISPOSABLE) ×2
HANDPIECE INTERPULSE COAX TIP (DISPOSABLE)
KIT BASIN OR (CUSTOM PROCEDURE TRAY) ×3 IMPLANT
KIT ROOM TURNOVER OR (KITS) ×3 IMPLANT
KIT STIMULAN RAPID CURE 5CC (Orthopedic Implant) ×3 IMPLANT
MANIFOLD NEPTUNE II (INSTRUMENTS) ×3 IMPLANT
NS IRRIG 1000ML POUR BTL (IV SOLUTION) ×15 IMPLANT
PACK ORTHO EXTREMITY (CUSTOM PROCEDURE TRAY) ×3 IMPLANT
PAD ARMBOARD 7.5X6 YLW CONV (MISCELLANEOUS) ×6 IMPLANT
PAD CAST 4YDX4 CTTN HI CHSV (CAST SUPPLIES) IMPLANT
PADDING CAST COTTON 4X4 STRL (CAST SUPPLIES)
SET HNDPC FAN SPRY TIP SCT (DISPOSABLE) IMPLANT
SPONGE GAUZE 4X4 12PLY (GAUZE/BANDAGES/DRESSINGS) IMPLANT
SPONGE LAP 18X18 X RAY DECT (DISPOSABLE) ×9 IMPLANT
SPONGE LAP 4X18 X RAY DECT (DISPOSABLE) ×3 IMPLANT
STOCKINETTE IMPERVIOUS 9X36 MD (GAUZE/BANDAGES/DRESSINGS) ×3 IMPLANT
SUT ETHILON 2 0 FS 18 (SUTURE) ×6 IMPLANT
SUT ETHILON 3 0 PS 1 (SUTURE) ×24 IMPLANT
SUT ETHILON 4 0 PS 2 18 (SUTURE) IMPLANT
SUT PROLENE 3 0 PS 2 (SUTURE) ×3 IMPLANT
SUT VIC AB 2-0 SH 27 (SUTURE) ×10
SUT VIC AB 2-0 SH 27XBRD (SUTURE) ×5 IMPLANT
SUT VIC AB 3-0 SH 27 (SUTURE)
SUT VIC AB 3-0 SH 27X BRD (SUTURE) IMPLANT
TOWEL OR 17X24 6PK STRL BLUE (TOWEL DISPOSABLE) ×3 IMPLANT
TOWEL OR 17X26 10 PK STRL BLUE (TOWEL DISPOSABLE) ×3 IMPLANT
TUBE ANAEROBIC SPECIMEN COL (MISCELLANEOUS) IMPLANT
TUBE CONNECTING 12'X1/4 (SUCTIONS) ×2
TUBE CONNECTING 12X1/4 (SUCTIONS) ×4 IMPLANT
UNDERPAD 30X30 INCONTINENT (UNDERPADS AND DIAPERS) ×3 IMPLANT
WATER STERILE IRR 1000ML POUR (IV SOLUTION) ×3 IMPLANT
YANKAUER SUCT BULB TIP NO VENT (SUCTIONS) ×3 IMPLANT

## 2014-03-06 NOTE — Transfer of Care (Signed)
Immediate Anesthesia Transfer of Care Note  Patient: Valerie Rose  Procedure(s) Performed: Procedure(s): IRRIGATION AND DEBRIDEMENT EXTREMITY WITH ABX BEAD PLACEMENT WITH DELAYED PRIMARY CLOSURE. (Left)  Patient Location: PACU  Anesthesia Type:General  Level of Consciousness: awake and alert   Airway & Oxygen Therapy: Patient Spontanous Breathing and Patient connected to nasal cannula oxygen  Post-op Assessment: Report given to PACU RN and Post -op Vital signs reviewed and stable  Post vital signs: Reviewed and stable  Complications: No apparent anesthesia complications
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
## 2014-03-06 NOTE — Brief Op Note (Signed)
03/04/2014 - 03/06/2014  5:02 PM  PATIENT:  Valerie Rose  23 y.o. female  PRE-OPERATIVE DIAGNOSIS:  OPEN LEFT LEG LACERATION x 2  POST-OPERATIVE DIAGNOSIS:  OPEN LEFT LEG LACERATION x 2  PROCEDURE:  Procedure(s): IRRIGATION AND DEBRIDEMENT EXTREMITY WITH ABX BEAD PLACEMENT WITH DELAYED PRIMARY CLOSURE.  SURGEON:  Surgeon(s): Cammy CopaGregory Scott Dean, MD  ASSISTANT: none  ANESTHESIA:   general  EBL: 50 ml    Total I/O In: 1050 [I.V.:1050] Out: -   BLOOD ADMINISTERED: none  DRAINS: none   LOCAL MEDICATIONS USED:  none  SPECIMEN:  No Specimen  COUNTS:  YES  TOURNIQUET:  * No tourniquets in log *  DICTATION: .Other Dictation: Dictation Number [id number]  PLAN OF CARE: Admit to inpatient   PATIENT DISPOSITION:  PACU - hemodynamically stable

## 2014-03-06 NOTE — Anesthesia Procedure Notes (Signed)
Procedure Name: LMA Insertion Date/Time: 03/06/2014 3:10 PM Performed by: Marni GriffonJAMES, [name] Pre-anesthesia Checklist: Patient identified, Emergency Drugs available, Suction available and Patient being monitored Patient Re-evaluated:Patient Re-evaluated prior to inductionOxygen Delivery Method: Circle system utilized Preoxygenation: Pre-oxygenation with 100% oxygen Intubation Type: IV induction Ventilation: Mask ventilation without difficulty LMA: LMA inserted LMA Size: 4.0 Placement Confirmation: breath sounds checked- equal and bilateral and positive ETCO2 Tube secured with: Tape (taped across cheeks; gauze roll b/t teeth) Dental Injury: Teeth and Oropharynx as per pre-operative assessment

## 2014-03-06 NOTE — Progress Notes (Signed)
Heading back to the OR with Dr. August Saucerean. Hopefully she will progress toward CIR. Patient examined and I agree with the assessment and plan  Violeta GelinasBurke Thompson, MD, MPH, FACS Trauma: [phone] General Surgery: [phone]  03/06/2014 1:09 PM

## 2014-03-06 NOTE — Progress Notes (Signed)
To or today for i/d delayed primary closure All questions answered
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
## 2014-03-06 NOTE — Anesthesia Preprocedure Evaluation (Addendum)
Anesthesia Evaluation  Patient identified by MRN, date of birth, ID band Patient awake    Reviewed: Allergy & Precautions, H&P , NPO status , Patient's Chart, lab work & pertinent test results, reviewed documented beta blocker date and time   Airway Mallampati: II TM Distance: >3 FB Neck ROM: Full  Mouth opening: Limited Mouth Opening  Dental  (+) Teeth Intact, Dental Advisory Given   Pulmonary Current Smoker,          Cardiovascular     Neuro/Psych    GI/Hepatic   Endo/Other    Renal/GU      Musculoskeletal   Abdominal   Peds  Hematology   Anesthesia Other Findings Narrow palette  Reproductive/Obstetrics                        Anesthesia Physical Anesthesia Plan  ASA: II  Anesthesia Plan: General   Post-op Pain Management:    Induction: Intravenous  Airway Management Planned: LMA  Additional Equipment:   Intra-op Plan:   Post-operative Plan: Extubation in OR  Informed Consent: I have reviewed the patients History and Physical, chart, labs and discussed the procedure including the risks, benefits and alternatives for the proposed anesthesia with the patient or authorized representative who has indicated his/her understanding and acceptance.     Plan Discussed with: CRNA and Surgeon  Anesthesia Plan Comments:        Anesthesia Quick Evaluation

## 2014-03-07 DIAGNOSIS — S329XXA Fracture of unspecified parts of lumbosacral spine and pelvis, initial encounter for closed fracture: Secondary | ICD-10-CM

## 2014-03-07 MED ORDER — CEFAZOLIN SODIUM-DEXTROSE 2-3 GM-% IV SOLR
2.0000 g | Freq: Three times a day (TID) | INTRAVENOUS | Status: AC
  Administered 2014-03-07: 2 g via INTRAVENOUS
  Filled 2014-03-07: qty 50

## 2014-03-07 MED ORDER — SODIUM CHLORIDE 0.9 % IJ SOLN: 3.0000 mL | INTRAMUSCULAR | Status: DC | PRN

## 2014-03-07 MED ORDER — METRONIDAZOLE IN NACL 5-0.79 MG/ML-% IV SOLN
500.0000 mg | Freq: Three times a day (TID) | INTRAVENOUS | Status: AC
  Administered 2014-03-07: 500 mg via INTRAVENOUS
  Filled 2014-03-07: qty 100
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
## 2014-03-07 NOTE — Progress Notes (Signed)
CSX completed SBIRT with pt.  Pt denies SA issues.  No further intervention needed.
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
## 2014-03-07 NOTE — Progress Notes (Signed)
Patient is doing okay.  Very sore.  Awaiting Rehab bed.  KVO IVFs.  This patient has been seen and I agree with the findings and treatment plan.  Marta LamasJames O. Gae BonWyatt, III, MD, FACS [phone] (pager) [phone] (direct pager) Trauma Surgeon
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
## 2014-03-07 NOTE — Progress Notes (Signed)
Pt stable Dressings dry Ok for rehab today Needs standard post op abx dose and dvt prophylaxis Ok for knee rom left nwb left for 3 weeks
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
## 2014-03-08 ENCOUNTER — Encounter (HOSPITAL_COMMUNITY): Payer: Self-pay | Admitting: Orthopedic Surgery

## 2014-03-08 ENCOUNTER — Inpatient Hospital Stay (HOSPITAL_COMMUNITY)
Admission: RE | Admit: 2014-03-08 | Discharge: 2014-03-13 | DRG: 945 | Disposition: A | Discharge status: Home / Self Care | Payer: BC Managed Care – PPO | Source: Intra-hospital | Attending: Physical Medicine & Rehabilitation | Admitting: Physical Medicine & Rehabilitation

## 2014-03-08 DIAGNOSIS — IMO0002 Reserved for concepts with insufficient information to code with codable children: Secondary | ICD-10-CM

## 2014-03-08 DIAGNOSIS — S060XAA Concussion with loss of consciousness status unknown, initial encounter: Secondary | ICD-10-CM | POA: Diagnosis present

## 2014-03-08 DIAGNOSIS — S322XXA Fracture of coccyx, initial encounter for closed fracture: Secondary | ICD-10-CM

## 2014-03-08 DIAGNOSIS — S3210XS Unspecified fracture of sacrum, sequela: Secondary | ICD-10-CM

## 2014-03-08 DIAGNOSIS — Z5189 Encounter for other specified aftercare: Principal | ICD-10-CM

## 2014-03-08 DIAGNOSIS — S3210XA Unspecified fracture of sacrum, initial encounter for closed fracture: Secondary | ICD-10-CM | POA: Diagnosis present

## 2014-03-08 DIAGNOSIS — S32509A Unspecified fracture of unspecified pubis, initial encounter for closed fracture: Secondary | ICD-10-CM | POA: Diagnosis present

## 2014-03-08 DIAGNOSIS — S32599D Other specified fracture of unspecified pubis, subsequent encounter for fracture with routine healing: Secondary | ICD-10-CM

## 2014-03-08 DIAGNOSIS — S060X9A Concussion with loss of consciousness of unspecified duration, initial encounter: Secondary | ICD-10-CM | POA: Diagnosis present

## 2014-03-08 DIAGNOSIS — F17200 Nicotine dependence, unspecified, uncomplicated: Secondary | ICD-10-CM | POA: Diagnosis present

## 2014-03-08 DIAGNOSIS — S060X1A Concussion with loss of consciousness of 30 minutes or less, initial encounter: Secondary | ICD-10-CM

## 2014-03-08 DIAGNOSIS — D62 Acute posthemorrhagic anemia: Secondary | ICD-10-CM | POA: Diagnosis present

## 2014-03-08 DIAGNOSIS — IMO0001 Reserved for inherently not codable concepts without codable children: Secondary | ICD-10-CM

## 2014-03-08 DIAGNOSIS — R339 Retention of urine, unspecified: Secondary | ICD-10-CM | POA: Diagnosis not present

## 2014-03-08 DIAGNOSIS — S32009A Unspecified fracture of unspecified lumbar vertebra, initial encounter for closed fracture: Secondary | ICD-10-CM | POA: Diagnosis present

## 2014-03-08 DIAGNOSIS — S329XXA Fracture of unspecified parts of lumbosacral spine and pelvis, initial encounter for closed fracture: Secondary | ICD-10-CM

## 2014-03-08 DIAGNOSIS — F1010 Alcohol abuse, uncomplicated: Secondary | ICD-10-CM | POA: Diagnosis present

## 2014-03-08 LAB — CBC
HEMATOCRIT: 31.5 % — AB (ref 36.0–46.0)
Hemoglobin: 10.6 g/dL — ABNORMAL LOW (ref 12.0–15.0)
MCH: 30.7 pg (ref 26.0–34.0)
MCHC: 33.7 g/dL (ref 30.0–36.0)
MCV: 91.3 fL (ref 78.0–100.0)
Platelets: 168 10*3/uL (ref 150–400)
RBC: 3.45 MIL/uL — ABNORMAL LOW (ref 3.87–5.11)
RDW: 12.5 % (ref 11.5–15.5)
WBC: 6.3 10*3/uL (ref 4.0–10.5)

## 2014-03-08 LAB — CREATININE, SERUM: CREATININE: 0.54 mg/dL (ref 0.50–1.10)

## 2014-03-08 MED ORDER — NAPROXEN 500 MG PO TABS
500.0000 mg | ORAL_TABLET | Freq: Two times a day (BID) | ORAL | Status: DC
  Administered 2014-03-08 – 2014-03-12 (×8): 500 mg via ORAL
  Filled 2014-03-08 (×12): qty 1

## 2014-03-08 MED ORDER — SORBITOL 70 % SOLN
30.0000 mL | Freq: Every day | Status: DC | PRN
  Administered 2014-03-08: 30 mL via ORAL
  Filled 2014-03-08: qty 30

## 2014-03-08 MED ORDER — CHLORHEXIDINE GLUCONATE 4 % EX LIQD
60.0000 mL | Freq: Once | CUTANEOUS | Status: DC
  Filled 2014-03-08: qty 60

## 2014-03-08 MED ORDER — ACETAMINOPHEN 325 MG PO TABS
325.0000 mg | ORAL_TABLET | ORAL | Status: DC | PRN
  Administered 2014-03-08: 650 mg via ORAL
  Filled 2014-03-08 (×2): qty 2

## 2014-03-08 MED ORDER — ENOXAPARIN SODIUM 40 MG/0.4ML ~~LOC~~ SOLN: 40.0000 mg | SUBCUTANEOUS | Status: DC

## 2014-03-08 MED ORDER — POLYETHYLENE GLYCOL 3350 17 G PO PACK
17.0000 g | PACK | Freq: Every day | ORAL | Status: DC
  Administered 2014-03-09 – 2014-03-12 (×4): 17 g via ORAL
  Filled 2014-03-08 (×6): qty 1

## 2014-03-08 MED ORDER — ONDANSETRON HCL 4 MG PO TABS
4.0000 mg | ORAL_TABLET | Freq: Four times a day (QID) | ORAL | Status: DC | PRN
  Administered 2014-03-09: 4 mg via ORAL
  Filled 2014-03-08: qty 1

## 2014-03-08 MED ORDER — ONDANSETRON HCL 4 MG/2ML IJ SOLN: 4.0000 mg | Freq: Four times a day (QID) | INTRAMUSCULAR | Status: DC | PRN

## 2014-03-08 MED ORDER — METHOCARBAMOL 500 MG PO TABS
500.0000 mg | ORAL_TABLET | Freq: Four times a day (QID) | ORAL | Status: DC | PRN
  Administered 2014-03-08 – 2014-03-09 (×2): 500 mg via ORAL
  Filled 2014-03-08 (×2): qty 1

## 2014-03-08 MED ORDER — OXYCODONE HCL 5 MG PO TABS
10.0000 mg | ORAL_TABLET | ORAL | Status: DC | PRN
Start: 2014-03-08 — End: 2014-03-13
  Administered 2014-03-08 – 2014-03-09 (×6): 10 mg via ORAL
  Administered 2014-03-10: 20 mg via ORAL
  Administered 2014-03-10 (×2): 10 mg via ORAL
  Administered 2014-03-11 (×2): 20 mg via ORAL
  Administered 2014-03-12 – 2014-03-13 (×4): 10 mg via ORAL
  Filled 2014-03-08 (×2): qty 2
  Filled 2014-03-08: qty 4
  Filled 2014-03-08 (×3): qty 2
  Filled 2014-03-08: qty 4
  Filled 2014-03-08 (×2): qty 2
  Filled 2014-03-08: qty 4
  Filled 2014-03-08 (×3): qty 2
  Filled 2014-03-08: qty 4
  Filled 2014-03-08: qty 2

## 2014-03-08 MED ORDER — ENOXAPARIN SODIUM 40 MG/0.4ML ~~LOC~~ SOLN
40.0000 mg | SUBCUTANEOUS | Status: DC
  Administered 2014-03-09 – 2014-03-13 (×5): 40 mg via SUBCUTANEOUS
  Filled 2014-03-08 (×6): qty 0.4

## 2014-03-08 MED ORDER — DOCUSATE SODIUM 100 MG PO CAPS
100.0000 mg | ORAL_CAPSULE | Freq: Two times a day (BID) | ORAL | Status: DC
  Administered 2014-03-08 – 2014-03-13 (×10): 100 mg via ORAL
  Filled 2014-03-08 (×12): qty 1

## 2014-03-08 MED ORDER — CEFAZOLIN SODIUM-DEXTROSE 2-3 GM-% IV SOLR: 2.0000 g | INTRAVENOUS | Status: DC

## 2014-03-08 NOTE — Progress Notes (Signed)
Patient being transferred to CIR. Report  [facility]. Patient will be going to [id number].

## 2014-03-08 NOTE — Progress Notes (Signed)
Patient arrived to unit around [id number] with mother and sister from [address]. Explained patient to new surroundings and and falls protocol explained with patient and safety agreement signed and patient verbalized understanding. Oriented to room, no further questions or concerns at this time, Cont with plan of care. Ramgeet, Phill MutterMelissa Rebecca
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
## 2014-03-08 NOTE — Progress Notes (Signed)
Pt stable -  Awaiting rehab Needs knee rom with PT

## 2014-03-08 NOTE — Progress Notes (Signed)
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 to address physical and functional deficits in the context of the above medical diagnosis(es)? Yes Addressing deficits in the following areas: balance, endurance, locomotion, strength, transferring, bowel/bladder control, bathing, dressing, feeding, toileting and cognition 5. Can the patient actively participate in an intensive therapy program of at least 3 hrs of therapy per day at least 5 days per week? Potentially 6. The potential for patient to make measurable gains while on inpatient rehab is excellent 7. Anticipated functional outcomes upon discharge from inpatient rehab are modified independent with PT, modified independent with OT, modified independent with SLP. 8. Estimated rehab length of stay to reach the above functional goals is: 7-10 days 9. Does the patient have adequate social supports to accommodate these discharge functional goals? Yes 10. Anticipated D/C setting: Home 11. Anticipated post D/C treatments: HH therapy 12. Overall Rehab/Functional Prognosis: good RECOMMENDATIONS:  This patient's condition is appropriate for continued rehabilitative care in the following setting: CIR  Patient has agreed to participate in recommended program. Yes  Note that insurance prior authorization may be required for reimbursement for recommended care.  Comment: per Ortho is ready for  Rehab, NWB LLE  03/05/2014
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
## 2014-03-08 NOTE — Discharge Summary (Signed)
Burke Thompson, MD, MPH, FACS Trauma: [phone] General Surgery: [phone]  

## 2014-03-08 NOTE — Progress Notes (Signed)
To CIR today Valerie GelinasBurke Thompson, MD, MPH, FACS Trauma: [phone] General Surgery: [phone]
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
## 2014-03-08 NOTE — Progress Notes (Signed)
Awaiting call from CIR with room number and then will discharge patient to CIR.
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
## 2014-03-08 NOTE — Clinical Social Work Note (Signed)
Patient has been accepted and insurance approval received for transfer to inpatient rehab today.  Patient and family agreeable.  Clinical Social Worker will sign off for now as social work intervention is no longer needed. Please consult us again if new need arises.  Macario GoldsJesse Scinto, Kentucky[place]CSW [phone]
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
## 2014-03-09 ENCOUNTER — Inpatient Hospital Stay (HOSPITAL_COMMUNITY): Payer: BC Managed Care – PPO

## 2014-03-09 ENCOUNTER — Inpatient Hospital Stay (HOSPITAL_COMMUNITY): Payer: BC Managed Care – PPO | Admitting: Occupational Therapy

## 2014-03-09 ENCOUNTER — Inpatient Hospital Stay (HOSPITAL_COMMUNITY): Payer: BC Managed Care – PPO | Admitting: Physical Therapy

## 2014-03-09 DIAGNOSIS — S3210XA Unspecified fracture of sacrum, initial encounter for closed fracture: Secondary | ICD-10-CM

## 2014-03-09 DIAGNOSIS — M7989 Other specified soft tissue disorders: Secondary | ICD-10-CM

## 2014-03-09 DIAGNOSIS — S322XXA Fracture of coccyx, initial encounter for closed fracture: Secondary | ICD-10-CM

## 2014-03-09 LAB — COMPREHENSIVE METABOLIC PANEL
ALBUMIN: 3.3 g/dL — AB (ref 3.5–5.2)
ALK PHOS: 59 U/L (ref 39–117)
ALT: 24 U/L (ref 0–35)
AST: 38 U/L — AB (ref 0–37)
BUN: 6 mg/dL (ref 6–23)
CHLORIDE: 100 meq/L (ref 96–112)
CO2: 30 mEq/L (ref 19–32)
Calcium: 10 mg/dL (ref 8.4–10.5)
Creatinine, Ser: 0.65 mg/dL (ref 0.50–1.10)
GFR calc Af Amer: >90 mL/min (ref >90)
GFR calc non Af Amer: >90 mL/min (ref >90)
Glucose, Bld: 112 mg/dL — ABNORMAL HIGH (ref 70–99)
POTASSIUM: 5 meq/L (ref 3.7–5.3)
SODIUM: 142 meq/L (ref 137–147)
Total Bilirubin: 0.3 mg/dL (ref 0.3–1.2)
Total Protein: 6.1 g/dL (ref 6.0–8.3)

## 2014-03-09 LAB — CBC WITH DIFFERENTIAL/PLATELET
BASOS PCT: 0 % (ref 0–1)
Basophils Absolute: 0 10*3/uL (ref 0.0–0.1)
Eosinophils Absolute: 0.4 10*3/uL (ref 0.0–0.7)
Eosinophils Relative: 6 % — ABNORMAL HIGH (ref 0–5)
HCT: 31.6 % — ABNORMAL LOW (ref 36.0–46.0)
Hemoglobin: 10.5 g/dL — ABNORMAL LOW (ref 12.0–15.0)
Lymphocytes Relative: 40 % (ref 12–46)
Lymphs Abs: 2.7 10*3/uL (ref 0.7–4.0)
MCH: 30.6 pg (ref 26.0–34.0)
MCHC: 33.2 g/dL (ref 30.0–36.0)
MCV: 92.1 fL (ref 78.0–100.0)
Monocytes Absolute: 0.6 10*3/uL (ref 0.1–1.0)
Monocytes Relative: 9 % (ref 3–12)
NEUTROS ABS: 3.1 10*3/uL (ref 1.7–7.7)
NEUTROS PCT: 45 % (ref 43–77)
PLATELETS: 170 10*3/uL (ref 150–400)
RBC: 3.43 MIL/uL — AB (ref 3.87–5.11)
RDW: 12.5 % (ref 11.5–15.5)
WBC: 6.9 10*3/uL (ref 4.0–10.5)

## 2014-03-09 MED ORDER — TRAZODONE HCL 50 MG PO TABS
50.0000 mg | ORAL_TABLET | Freq: Every evening | ORAL | Status: DC | PRN
  Administered 2014-03-09 – 2014-03-12 (×4): 50 mg via ORAL
  Filled 2014-03-09 (×4): qty 1

## 2014-03-09 NOTE — Progress Notes (Signed)
Patient information reviewed and entered into eRehab system by Marie Noel, RN, CRRN, PPS Coordinator.  Information including medical coding and functional independence measure will be reviewed and updated through discharge.    

## 2014-03-09 NOTE — Evaluation (Signed)
Reviewed and in agreement with evaluation and treatment provided.  
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
## 2014-03-09 NOTE — Progress Notes (Signed)
*  Preliminary Results* Bilateral lower extremity venous duplex completed. Bilateral lower extremities are negative for deep vein thrombosis. There is no evidence of Baker's cyst bilaterally.  03/09/2014  Gertie FeyMichelle Simren Popson, RVT, RDCS, RDMS
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
## 2014-03-09 NOTE — Progress Notes (Addendum)
Occupational Therapy Session Note  Patient Details  Name: Valerie Rose MRN: [id number] Date of Birth: 09-Aug-1991  Today's Date: 03/09/2014 Time: 1430-1500 Time Calculation (min): 30 min  Skilled Therapeutic Interventions/Progress Updates:  Patient with 6/10 complaints of pain in LLE, RN aware. Patient received supine in bed asleep. Patient easy to awake and arouse. Patient engaged in bed mobility with supervision, stood with RW with supervision, then ambulated into bathroom for toilet transfer & toileting with supervision. Patient required min verbal cues for safety with RW during functional mobility/ambulation. Patient ambulated > sink to wash hands, then started ambulating > therapy gym. At nurses station patient with complaints of dizziness and being "tired". Therapist had patient sit in w/c and BP=126/77; NT made aware. After sitting, patient with no more complaints of dizziness. Patient propelled self > therapy gym and therapist educated patient on BUE strengthening exercises using green theraband. Patient able to perform 2 sets of 10 reps for 6 shoulder exercises with min verbal cues for memory and body mechanics. Patient propelled self back to room and left seated in w/c with all needs within reach. Therapist reiterated that patient must call for assistance for mobility aspects, patient receptive to this.   Precautions:  Precautions Precautions: Fall Restrictions Weight Bearing Restrictions: Yes LLE Weight Bearing: Non weight bearing  See FIM for current functional status  Therapy/Group: Individual Therapy  CLAY,PATRICIA 03/09/2014, 3:37 PM
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
## 2014-03-09 NOTE — Care Management Note (Signed)
[facility] Individual Statement of Services  Patient Name:  Valerie Rose  Date:  03/09/2014  Welcome to the [facility]patient [facility].  Our goal is to provide you with an individualized program based on your diagnosis and situation, designed to meet your specific needs.  With this comprehensive rehabilitation program, you will be expected to participate in at least 3 hours of rehabilitation therapies Monday-Friday, with modified therapy programming on the weekends.  Your rehabilitation program will include the following services:  Physical Therapy (PT), Occupational Therapy (OT), Speech Therapy (ST), 24 hour per day rehabilitation nursing, Therapeutic Recreaction (TR), Case Management (Social Worker), Rehabilitation Medicine, Nutrition Services and Pharmacy Services  Weekly team conferences will be held on Tuesdays to discuss your progress.  Your Social Worker will talk with you frequently to get your input and to update you on team discussions.  Team conferences with you and your family in attendance may also be held.  Expected length of stay: 5-7 days  Overall anticipated outcome: modified independent  Depending on your progress and recovery, your program may change. Your Social Worker will coordinate services and will keep you informed of any changes. Your Social Worker's name and contact numbers are listed  below.  The following services may also be recommended but are not provided by the Inpatient Rehabilitation Center:   Driving Evaluations  Home Health Rehabiltiation Services  Outpatient Rehabilitation Services  Vocational Rehabilitation   Arrangements will be made to provide these services after discharge if needed.  Arrangements include referral to agencies that provide these services.  Your insurance has been verified to be:  BCBS Your primary doctor is:  Dr. Neita CarpSasser  Pertinent information will be shared with your doctor and your insurance  company.  Social Worker:  PendletonLucy Hoyle, [place] [phone] or [phone]) [phone]   Information discussed with and copy given to patient by: Amada JupiterHOYLE, LUCY, 03/09/2014, 3:44 PM

## 2014-03-10 ENCOUNTER — Inpatient Hospital Stay (HOSPITAL_COMMUNITY): Payer: BC Managed Care – PPO | Admitting: *Deleted

## 2014-03-10 ENCOUNTER — Encounter (HOSPITAL_COMMUNITY): Payer: BC Managed Care – PPO | Admitting: Occupational Therapy

## 2014-03-10 ENCOUNTER — Inpatient Hospital Stay (HOSPITAL_COMMUNITY): Payer: BC Managed Care – PPO | Admitting: Speech Pathology

## 2014-03-10 DIAGNOSIS — S060X1A Concussion with loss of consciousness of 30 minutes or less, initial encounter: Secondary | ICD-10-CM

## 2014-03-10 DIAGNOSIS — R339 Retention of urine, unspecified: Secondary | ICD-10-CM

## 2014-03-10 DIAGNOSIS — IMO0001 Reserved for inherently not codable concepts without codable children: Secondary | ICD-10-CM

## 2014-03-10 DIAGNOSIS — IMO0002 Reserved for concepts with insufficient information to code with codable children: Secondary | ICD-10-CM
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
## 2014-03-10 NOTE — Progress Notes (Signed)
Physical Therapy Session Note  Patient Details  Name: Valerie AgeeKendra L Connors MRN: [id number] Date of Birth: Feb 03, 1991  Today's Date: 03/10/2014    Skilled Therapeutic Interventions/Progress Updates:  Session I [phone] (45 min) Today's session has started when patient was finishing her shower Supervision for balance and safety when in the shower, patient needs min A to don shoes.  Gait training with RW 2 x 120 feet and CGA.  Stair training 2 x 5 with B Rails and CGA. TE: ankle pumps, SAQ and knee extension. Patient returned to room , wanted to lay down. All needs addressed.  Session II 1330-1415 (45 min) Gait training with RW ,maintaining NWB , 2 x 150 feet with CGA. TE: SLR 2 x 12, Hel slides 2 x 15, hip  abd 2 x 15, ankle pumps 2 x15, knee extensions 2 x 15. Hand ergometer x 7 min.  Patient returned to room transferred to recliner, left with all needs within reach, c/o pain 7/10 ,reported to nursing.  Therapy Documentation Precautions:  Precautions Precautions: Fall Restrictions Weight Bearing Restrictions: Yes LLE Weight Bearing: Non weight bearing  See FIM for current functional status  Therapy/Group: Individual Therapy  Dorna MaiCzajkowska, Ewa W 03/10/2014, 12:17 PM

## 2014-03-10 NOTE — Progress Notes (Signed)
Occupational Therapy Session Note  Patient Details  Name: Valerie Rose MRN: [id number] Date of Birth: Oct 31, 1990  Today's Date: 03/10/2014 Time:  -   0800-0900  (60 min)    Short Term Goals: Week 1:  OT Short Term Goal 1 (Week 1): STG=LTG due to short LOS  Skilled Therapeutic Interventions/Progress Updates:    Patient with 5/10 complaints of pain in LLE, RN aware.  Patient received supine.  Patient engaged in bed mobility with supervision.  Stood with RW and retrieved clothes out of dresser drawers with supervision.  Ambulated to bathroom for toilet transfer & toileting with supervision.  .Educated pt on turning with walker in a safe manner to get in shower. Transferred  to shower bench and doffed left footie with reacher and finished undressing with supervision.   Pt. Bathed self with multiple episodes of dropping objects.  Was able to pick up using grab bar for balance. Pt left with PT for next session.       Therapy Documentation Precautions:  Precautions Precautions: Fall Restrictions Weight Bearing Restrictions: Yes LLE Weight Bearing: Non weight bearing   Pain: Pain Assessment Pain Assessment: 0-10 Pain Score: 5 Pain Type: Surgical pain Pain Location: Hip Pain Orientation: Left Pain Onset: On-going Patients Stated Pain Goal: 5 Pain Intervention(s): Medication (See eMAR) ADL: ADL ADL Comments: see FIM        See FIM for current functional status  Therapy/Group: Individual Therapy  Humberto Sealsdwards, Elizabeth J 03/10/2014, 8:46 AM

## 2014-03-11 ENCOUNTER — Inpatient Hospital Stay (HOSPITAL_COMMUNITY): Payer: BC Managed Care – PPO | Admitting: *Deleted

## 2014-03-11 MED ORDER — OXYCODONE HCL 5 MG PO TABS
2.5000 mg | ORAL_TABLET | Freq: Once | ORAL | Status: AC
  Administered 2014-03-11: 2.5 mg via ORAL
  Filled 2014-03-11: qty 1
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
## 2014-03-11 NOTE — Progress Notes (Signed)
Physical Therapy Session Note  Patient Details  Name: Valerie Rose MRN: [id number] Date of Birth: [birth year]  Today's Date: 03/11/2014  Skilled Therapeutic Interventions/Progress Updates:  Session I [phone] (60 min) Gait training with RW with maintaining NWB precautions on L side 2 x 150 feet with CGA ,and cues for posture. Training in obstacle negotiation with walker and in safety techniques when turning. Arm bike 1 x 10 min , level 4.6 in order to increase UE strength and overall activity tolerance. TE: in sitting SAQ 2 x15 , isometric knee extensions 2 x 15, ankle  Df and PF 2 x 15 each,. In supine :SLR 2 x 15, hip abd 2 x 15, heel slides 2 x 15, bridging on R Le 2 x10. Training in sit <=>stand from different height mat Rose/o use of UE 3 x 7. Transfer training with Supervision(sit to stand , sit to supine) Patient education on safety and energy conservation techniques. Patient complained of pain in R LE, reported to nursing ,in order to receive pain medicine. Session II 1250-1335 (45 min) Session focused on transfer training, chair<=>cair, couch , bed and recliner.=>supervision for all transfers Gait training on tile and on carpet to prepare for different surfaces in the house upon d/c. Static standing balance 3 x 1min Rose/o UE support, dynamic standing balance training-ball bouncing on trampoline. Patient returned to room with all needs within reach.  Consulted with OT to make patient MI in the room.   Therapy Documentation Precautions:  Precautions Precautions: Fall Restrictions Weight Bearing Restrictions: Yes LLE Weight Bearing: Non weight bearing Vital Signs: Therapy Vitals Temp: 97.9 F (36.6 C) Temp src: Oral Pulse Rate: 67 Resp: 18 BP: 90/56 mmHg Patient Position (if appropriate): Lying Oxygen Therapy SpO2: 97 % O2 Device: None (Room air)  See FIM for current functional status  Therapy/Group: Individual Therapy  Dorna MaiCzajkowska, Valerie Rose 03/11/2014, 9:48 AM
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
## 2014-03-11 NOTE — Progress Notes (Addendum)
Occupational Therapy Session Note  Patient Details  Name: Valerie Rose MRN: [id number] Date of Birth: [birth year]  Today's Date: 03/11/2014 Time:  -   [id number]  (45 min)  1st session    Short Term Goals: Week 1:  OT Short Term Goal 1 (Week 1): STG=LTG due to short LOS  Skilled Therapeutic Interventions/Progress Updates:       1st session:  Addressed functional mobility, dynamic sitting and standing balance during BADL at shower level.  Pt. Retrieved clothes at RW level with supervision and minimal cues not to over reach for items and loose balance.  Pt. mainained NWB'ing on LLE.  Bathed at sitting and standing level using grab bar for balance in standing.   Pt. Completed dressing without AE not donning any socks like she did previous session.  Pt. Left in recliner with call bell and phone in reach.    Time:  1330-1415  (45 min)2nd session Pain:  5/10 LLE Individual session Focus of treatment was functional mobility, dynamic balance, standing tolerance, endurance.  Pt. Ambulated with RW from room to ADL apartment.  Engaged in Baking cookies.  Pt moved around safely in kitchen with no LOB.  Reiterated not to over reach for objects with NWB on one leg.  Pt. Gathered supplies and followed directions on package.  She stood and moved around for 45 minutes without sitting.   Practiced stepping over the shower stall with hopping technique with no assistance.  Pt. Ambulated back to room and was left in recliner with all needs in reach.  [facility]   Posted sign outside door.       Therapy Documentation Precautions:  Precautions Precautions: Fall Restrictions Weight Bearing Restrictions: Yes LLE Weight Bearing: Non weight bearing   Pain:  5/10 LLE  1st  And 2nd session   ADL: ADL ADL Comments: see FIM    See FIM for current functional status  Therapy/Group: Individual Therapy  Humberto Sealsdwards, Elizabeth J 03/11/2014, 7:58 AM

## 2014-03-12 ENCOUNTER — Inpatient Hospital Stay (HOSPITAL_COMMUNITY): Payer: BC Managed Care – PPO | Admitting: Physical Therapy

## 2014-03-12 ENCOUNTER — Encounter (HOSPITAL_COMMUNITY): Payer: BC Managed Care – PPO

## 2014-03-12 ENCOUNTER — Inpatient Hospital Stay (HOSPITAL_COMMUNITY): Payer: BC Managed Care – PPO

## 2014-03-12 DIAGNOSIS — S329XXA Fracture of unspecified parts of lumbosacral spine and pelvis, initial encounter for closed fracture: Secondary | ICD-10-CM

## 2014-03-12 NOTE — Progress Notes (Signed)
Physical Therapy Session Note  Patient Details  Name: Valerie AgeeKendra L Kibble MRN: [id number] Date of Birth: 04-13-1991  Today's Date: 03/12/2014 Time: 1135-1203 Time Calculation (min): 28 min  Short Term Goals= LTGs  Skilled Therapeutic Interventions/Progress Updates:   Pt. Found standing with assistance from mother in preparation to leave room to go to SUBWAY. Pt. Agreeable to physical therapy.  Therapeutic Activities: Pt. Ambulated >150'  mod I with RW room>atrium. Requires occasional standing rest break. W/C follow for x2 rest/recovery breaks and for safety. Pt. Educated on energy conservation and use of W/C for extended community activities. Pt. Performed static/dynamic standing, multiple sit<>stand with support surface. Pt. Performed functional tasks: reaching, bending, twisting, and single limb stance/balance with RW for additional support. Pt. Successfully navigated multiple obstacles with RW with distant supervision due to increased difficulty, with w/c follow for safety. Pt. remained in atrium seated in w/c with mother present. Pt. Needs to be provided for by family member. Pt. Reports no pain post-tx.  Therapy Documentation  Precautions:  Precautions Precautions: Fall Restrictions Weight Bearing Restrictions: Yes LLE Weight Bearing: Non weight bearing  Pain: Pain Assessment Pain Assessment: No/denies pain Pain Score: 0-No pain  Locomotion : Ambulation Ambulation/Gait Assistance: 6: Modified independent (Device/Increase time)   See FIM for current functional status  Therapy/Group: Individual Therapy  Raiford NobleSmith, Bradley R 03/12/2014, 12:13 PM

## 2014-03-12 NOTE — Progress Notes (Signed)
Occupational Therapy Session Note and Discharge Summary  Patient Details  Name: Valerie Rose MRN: [id number] Date of Birth: 1991-09-01  Today's Date: 03/12/2014 Time: 7412-8786 Time Calculation (min): 9 min Amount of Missed OT Time (min): 36 Minutes  Skilled Intervention: Patient and mother not in room at time of scheduled visit due to using off ward pass for noon meal.   Patient and mother encountered near end of planned session, returning to room.   Patient was challenged to attempt retrieving item off floor (using golfer's tee method to maintain NWB at LLE).  Patient completed task and ambulated back to her room with mother following in w/c.   No complaints or concerns expressed relating to planned discharge on 03/13/14.                                                                                                                 Discharge Summary  Patient has met 6 of 6 long term goals due to improved activity tolerance, improved balance and ability to compensate for deficits.  Patient to discharge at overall modified independent for BADL and supervision level for iADL due to unknown needs of 22-year old son.  Patient's caregiver Engineer, petroleum) is independent to provide the necessary supervision assistance at discharge for activities involving home management and child care in transitional living environment (Aunt's home).    Reasons goals not met: n/a  Recommendation:  Patient will benefit from ongoing skilled OT services in home health setting to continue to advance functional skills in the area of iADL to maintain primary role of caregiver for her son.  Equipment: shower chair  Reasons for discharge: treatment goals met  Patient/family agrees with progress made and goals achieved: Yes  OT Discharge Precautions/Restrictions  Precautions Precautions: Fall Restrictions Weight Bearing Restrictions: Yes LLE Weight Bearing: Non weight bearing  Pain Pain Assessment Pain Assessment:  No/denies pain Pain Score: 0-No pain Pain Type: Surgical pain Pain Location: Leg Pain Orientation: Left Pain Onset: On-going Pain Intervention(s): Medication (See eMAR)  ADL ADL ADL Comments: see FIM  Vision/Perception  Vision- History Baseline Vision/History: No visual deficits Wears Glasses: At all times (lost her glasses) Patient Visual Report: No change from baseline Vision- Assessment Vision Assessment?: No apparent visual deficits   Cognition Overall Cognitive Status: Within Functional Limits for tasks assessed Arousal/Alertness: Awake/alert Orientation Level: Oriented X4 Attention: Divided Divided Attention: Appears intact Memory: Appears intact Awareness: Appears intact Problem Solving: Appears intact Executive Function: Reasoning;Organizing;Decision Making;Self Monitoring;Self Correcting;Sequencing;Initiating Reasoning: Appears intact Sequencing: Appears intact Organizing: Appears intact Decision Making: Appears intact Initiating: Appears intact Self Correcting: Appears intact Safety/Judgment: Appears intact  Sensation Sensation Light Touch: Appears Intact Stereognosis: Appears Intact Hot/Cold: Appears Intact Proprioception: Appears Intact Coordination Gross Motor Movements are Fluid and Coordinated: Yes Fine Motor Movements are Fluid and Coordinated: Yes  Motor  Motor Motor: Within Functional Limits Motor - Skilled Clinical Observations: L knee weakness due to pain  Mobility  Bed Mobility Bed Mobility: Rolling Right;Rolling Left Rolling Left: 6: Modified independent (Device/Increase time) Left  Sidelying to Sit: 6: Modified independent (Device/Increase time) Transfers Transfers: Sit to Stand;Stand to Sit Sit to Stand: 6: Modified independent (Device/Increase time) Stand to Sit: 6: Modified independent (Device/Increase time)   Trunk/Postural Assessment  Cervical Assessment Cervical Assessment: Within Functional Limits Thoracic  Assessment Thoracic Assessment: Within Functional Limits Lumbar Assessment Lumbar Assessment: Within Functional Limits Postural Control Postural Control: Within Functional Limits   Balance Balance Balance Assessed: Yes Static Sitting Balance Static Sitting - Balance Support: Feet supported;No upper extremity supported Static Sitting - Level of Assistance: 7: Independent Dynamic Sitting Balance Dynamic Sitting - Balance Support: Right upper extremity supported Dynamic Sitting - Level of Assistance: 7: Independent Sitting balance - Comments: able to maintain sitting balance during functional activities with 1 UE on mat or RW Static Standing Balance Static Standing - Balance Support: Right upper extremity supported Static Standing - Level of Assistance: 6: Modified independent (Device/Increase time) Dynamic Standing Balance Dynamic Standing - Balance Support: Bilateral upper extremity supported Dynamic Standing - Level of Assistance: 6: Modified independent (Device/Increase time)  Extremity/Trunk Assessment RUE Assessment RUE Assessment: Within Functional Limits LUE Assessment LUE Assessment: Within Functional Limits  See FIM for current functional status  BARTHOLD,FRANK 03/12/2014, 3:24 PM
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
## 2014-03-12 NOTE — Discharge Summary (Signed)
Discharge summary job [id number]

## 2014-03-12 NOTE — Progress Notes (Signed)
Occupational Therapy Session Note  Patient Details  Name: Valerie Rose MRN: [id number] Date of Birth: October 21, 1990  Today's Date: 03/12/2014 Time: 1030-1130 Time Calculation (min): 60 min  Short Term Goals: Week 1:  OT Short Term Goal 1 (Week 1): STG=LTG due to short LOS  Skilled Therapeutic Interventions/Progress Updates: ADL-retraining with focus on dynamic standing balance, transfers, family education on progress with goals (with mother), bed mobility, safety awareness, and effective use of DME/AE.   Patient able to gather clothing and complete bathing/dressing at shower level, grooming standing at sink, unassisted, modified by use of light clothing, tub bench and RW.  Patient demo'd LOB only during standing at sink when distracted during assist with dressing change.   Following grooming after shower, patient completed bed mobility as instructed to include rolling to both right and left, without use of bed rails.   Patient was compliant with NWB direction throughout session.   Patient's mother was aware of plan for patient to live with her Aunt and she confirmed pt's description of environment.  No concerns raised during review of patient's status and her performance during  Therapies.  Patient endorses need for shower chair, w/c, and RW for home safety.  Therapy Documentation Precautions:  Precautions Precautions: Fall Restrictions Weight Bearing Restrictions: Yes LLE Weight Bearing: Non weight bearing  Pain: Pain Assessment Pain Assessment: No/denies pain Pain Score: 0-No pain  ADL: ADL ADL Comments: see FIM  See FIM for current functional status  Therapy/Group: Individual Therapy  Makaria Poarch 03/12/2014, 12:27 PM
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
## 2014-03-12 NOTE — Discharge Instructions (Signed)
Inpatient Rehab Discharge Instructions  Valerie AgeeKendra L Rose Discharge date and time: No discharge date for patient encounter.   Activities/Precautions/ Functional Status: Activity: non weight bearing left leg Diet: regular diet Wound Care: keep wound clean and dry Functional status:  ___ No restrictions     ___ Walk up steps independently ___ 24/7 supervision/assistance   ___ Walk up steps with assistance ___ Intermittent supervision/assistance  ___ Bathe/dress independently ___ Walk with walker     ___ Bathe/dress with assistance ___ Walk Independently    ___ Shower independently _x__ Walk with assistance    ___ Shower with assistance ___ No alcohol     ___ Return to work/school ________    COMMUNITY REFERRALS UPON DISCHARGE:    Home Health:   PT     OT     RN                      Agency: [facility] Phone: [phone]   Medical Equipment/Items Ordered: wheelchair, cushion, rolling walker, tub seat                                                     Agency/Supplier: [facility] @ [phone]   GENERAL COMMUNITY RESOURCES FOR PATIENT/FAMILY:  Mental Health:  As needed for any post traumatic symptoms:  [facility] @ [phone] to schedule appointment with counselor        Special Instructions:    My questions have been answered and I understand these instructions. I will adhere to these goals and the provided educational materials after my discharge from the hospital.  Patient/Caregiver Signature _______________________________ Date __________  Clinician Signature _______________________________________ Date __________  Please bring this form and your medication list with you to all your follow-up doctor's appointments.

## 2014-03-12 NOTE — Progress Notes (Signed)
Physical Therapy Discharge Summary  Patient Details  Name: Valerie Rose MRN: [id number] Date of Birth: 1991/02/15  Today's Date: 03/12/2014 Time: 0900-1000 Time Calculation (min): 60 min  Patient has met 7 of 7 long term goals due to improved activity tolerance, improved balance, improved postural control, increased strength, decreased pain and ability to compensate for deficits.  Patient to discharge at an ambulatory level Modified Independent.   Patient's care partner is independent to provide the necessary physical assistance at discharge.  Reasons goals not met: N/A  Recommendation:  Patient will benefit from ongoing skilled PT services in home health setting to continue to advance safe functional mobility, address ongoing impairments in strength, endurance, mobility, transfers, and minimize fall risk.  Equipment: w/c, RW  Reasons for discharge: treatment goals met  Patient/family agrees with progress made and goals achieved: Yes  PT Discharge Precautions/Restrictions Precautions Precautions: Fall Restrictions Weight Bearing Restrictions: Yes LLE Weight Bearing: Non weight bearing Cognition Orientation Level: Oriented X4 Sensation Sensation Light Touch: Appears Intact Stereognosis: Appears Intact Hot/Cold: Appears Intact Proprioception: Appears Intact Coordination Gross Motor Movements are Fluid and Coordinated: Yes Fine Motor Movements are Fluid and Coordinated: Yes Motor  Motor Motor: Within Functional Limits Motor - Skilled Clinical Observations: L knee weakness due to pain  Locomotion  Ambulation Ambulation/Gait Assistance: 6: Modified independent (Device/Increase time)  Trunk/Postural Assessment  Cervical Assessment Cervical Assessment: Within Functional Limits Thoracic Assessment Thoracic Assessment: Within Functional Limits Lumbar Assessment Lumbar Assessment: Within Functional Limits Postural Control Postural Control: Within Functional Limits   Balance Balance Balance Assessed: Yes Static Sitting Balance Static Sitting - Balance Support: Feet supported;No upper extremity supported Static Sitting - Level of Assistance: 7: Independent Dynamic Sitting Balance Dynamic Sitting - Balance Support: Right upper extremity supported Dynamic Sitting - Level of Assistance: 7: Independent Static Standing Balance Static Standing - Balance Support: Right upper extremity supported Static Standing - Level of Assistance: 6: Modified independent (Device/Increase time) Dynamic Standing Balance Dynamic Standing - Balance Support: Bilateral upper extremity supported Dynamic Standing - Level of Assistance: 6: Modified independent (Device/Increase time) Extremity Assessment      RLE Assessment RLE Assessment: Within Functional Limits LLE Assessment LLE Assessment: Exceptions to [facility] LLE Strength LLE Overall Strength Comments: L knee and hip at least 3/5, not formally tested due to pain; all else [facility]  See FIM for current functional status  Kenn File 03/12/2014, 2:01 PM

## 2014-03-12 NOTE — Anesthesia Postprocedure Evaluation (Signed)
  Anesthesia Post-op Note  Patient: Valerie Rose  Procedure(s) Performed: Procedure(s): IRRIGATION AND DEBRIDEMENT EXTREMITY WITH ABX BEAD PLACEMENT WITH DELAYED PRIMARY CLOSURE. (Left)  Patient Location: PACU  Anesthesia Type:General  Level of Consciousness: awake and alert   Airway and Oxygen Therapy: Patient Spontanous Breathing  Post-op Pain: mild  Post-op Assessment: Post-op Vital signs reviewed  Post-op Vital Signs: stable  Last Vitals:  Filed Vitals:   03/08/14 0551  BP: 98/43  Pulse: 55  Temp: 36.7 C  Resp: 16    Complications: No apparent anesthesia complications

## 2014-03-12 NOTE — Progress Notes (Signed)
Reviewed and in agreement with treatment provided.  Bayard Huggerebecca Varner, PT, DPT

## 2014-03-12 NOTE — Progress Notes (Signed)
Addendum Mobility: Supine <> sit and basic transfers using RW with mod I. Ambulation: 150 ft in controlled environment and 50 ft in home environment using RW with mod I, 50 ft in outdoor environment including grass to simulate home entry with supervision. Stair navigation: up/down 13 steps, B handrails with hop to pattern, mod I. W/c mobility: >300 ft in controlled and > 200 ft in community environments with mod I, able to manage ramps >3% with mod I with use of B UE and R LE for energy conservation.  Reviewed and in agreement with treatment provided.  Bayard Huggerebecca Varner, PT, DPT
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
## 2014-03-13 DIAGNOSIS — S329XXA Fracture of unspecified parts of lumbosacral spine and pelvis, initial encounter for closed fracture: Secondary | ICD-10-CM

## 2014-03-13 MED ORDER — OXYCODONE HCL 10 MG PO TABS: 10.0000 mg | ORAL_TABLET | ORAL | Status: DC | PRN

## 2014-03-13 MED ORDER — METHOCARBAMOL 500 MG PO TABS: 500.0000 mg | ORAL_TABLET | Freq: Four times a day (QID) | ORAL | Status: DC | PRN

## 2014-03-13 MED ORDER — NAPROXEN 500 MG PO TABS: 500.0000 mg | ORAL_TABLET | Freq: Two times a day (BID) | ORAL | Status: DC
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
## 2014-03-13 NOTE — Progress Notes (Signed)
Patient has been given discharge information via [name] PA. Patient verbalizes that she understands discharge instructions. Patient accompanied out by NA and her mom with patient belongings.Valerie BetterAndrea P Moore
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
## 2014-03-19 ENCOUNTER — Telehealth: Payer: Self-pay

## 2014-03-19 NOTE — Telephone Encounter (Signed)
[facility] is requesting a verbal order for PT 2x this week and 1x for 2 weeks. Contacted Valerie Rose @ [facility] to give her a verbal order per protocol.

## 2014-03-23 ENCOUNTER — Telehealth: Payer: Self-pay

## 2014-03-23 NOTE — Telephone Encounter (Signed)
FYI:: Valerie Rose(PT @ [facility]) caleld to inform Dr. Riley KillSwartz that patient has been discharged from nursing. Patient also refused PT. Patient reported to Valerie Rose that she is doing well and weightbearing with crutches.

## 2014-03-30 DIAGNOSIS — IMO0002 Reserved for concepts with insufficient information to code with codable children: Secondary | ICD-10-CM | POA: Diagnosis not present

## 2014-03-30 DIAGNOSIS — Z4801 Encounter for change or removal of surgical wound dressing: Secondary | ICD-10-CM

## 2014-03-30 DIAGNOSIS — Z48817 Encounter for surgical aftercare following surgery on the skin and subcutaneous tissue: Secondary | ICD-10-CM | POA: Diagnosis not present

## 2014-03-30 DIAGNOSIS — S72009D Fracture of unspecified part of neck of unspecified femur, subsequent encounter for closed fracture with routine healing: Secondary | ICD-10-CM | POA: Diagnosis not present

## 2014-03-30 DIAGNOSIS — D62 Acute posthemorrhagic anemia: Secondary | ICD-10-CM | POA: Diagnosis not present

## 2015-10-22 ENCOUNTER — Emergency Department (HOSPITAL_COMMUNITY)
Admission: EM | Admit: 2015-10-22 | Discharge: 2015-10-22 | Disposition: A | Discharge status: Home / Self Care | Payer: BLUE CROSS/BLUE SHIELD | Attending: Emergency Medicine | Admitting: Emergency Medicine

## 2015-10-22 ENCOUNTER — Encounter (HOSPITAL_COMMUNITY): Payer: Self-pay | Admitting: Emergency Medicine

## 2015-10-22 ENCOUNTER — Emergency Department (HOSPITAL_COMMUNITY): Payer: BLUE CROSS/BLUE SHIELD

## 2015-10-22 DIAGNOSIS — R51 Headache: Secondary | ICD-10-CM | POA: Diagnosis not present

## 2015-10-22 DIAGNOSIS — Z87891 Personal history of nicotine dependence: Secondary | ICD-10-CM | POA: Diagnosis not present

## 2015-10-22 DIAGNOSIS — Z3202 Encounter for pregnancy test, result negative: Secondary | ICD-10-CM | POA: Diagnosis not present

## 2015-10-22 DIAGNOSIS — R110 Nausea: Secondary | ICD-10-CM | POA: Diagnosis not present

## 2015-10-22 DIAGNOSIS — R519 Headache, unspecified: Secondary | ICD-10-CM

## 2015-10-22 DIAGNOSIS — R42 Dizziness and giddiness: Secondary | ICD-10-CM | POA: Insufficient documentation

## 2015-10-22 LAB — CBC WITH DIFFERENTIAL/PLATELET
BASOS ABS: 0 10*3/uL (ref 0.0–0.1)
BASOS PCT: 0 %
EOS ABS: 0.2 10*3/uL (ref 0.0–0.7)
EOS PCT: 2 %
HCT: 39 % (ref 36.0–46.0)
HEMOGLOBIN: 13.3 g/dL (ref 12.0–15.0)
Lymphocytes Relative: 35 %
Lymphs Abs: 2.7 10*3/uL (ref 0.7–4.0)
MCH: 31.1 pg (ref 26.0–34.0)
MCHC: 34.1 g/dL (ref 30.0–36.0)
MCV: 91.1 fL (ref 78.0–100.0)
Monocytes Absolute: 0.5 10*3/uL (ref 0.1–1.0)
Monocytes Relative: 6 %
NEUTROS PCT: 57 %
Neutro Abs: 4.3 10*3/uL (ref 1.7–7.7)
PLATELETS: 225 10*3/uL (ref 150–400)
RBC: 4.28 MIL/uL (ref 3.87–5.11)
RDW: 12.2 % (ref 11.5–15.5)
WBC: 7.6 10*3/uL (ref 4.0–10.5)

## 2015-10-22 LAB — I-STAT BETA HCG BLOOD, ED (MC, WL, AP ONLY)

## 2015-10-22 LAB — BASIC METABOLIC PANEL
ANION GAP: 7 (ref 5–15)
BUN: 8 mg/dL (ref 6–20)
CHLORIDE: 110 mmol/L (ref 101–111)
CO2: 25 mmol/L (ref 22–32)
Calcium: 9.5 mg/dL (ref 8.9–10.3)
Creatinine, Ser: 0.66 mg/dL (ref 0.44–1.00)
Glucose, Bld: 85 mg/dL (ref 65–99)
POTASSIUM: 3.5 mmol/L (ref 3.5–5.1)
SODIUM: 142 mmol/L (ref 135–145)

## 2015-10-22 MED ORDER — BARIUM SULFATE 2.1 % PO SUSP
ORAL | Status: AC
  Filled 2015-10-22: qty 2

## 2015-10-22 MED ORDER — DIPHENHYDRAMINE HCL 50 MG/ML IJ SOLN
12.5000 mg | Freq: Once | INTRAMUSCULAR | Status: AC
  Administered 2015-10-22: 12.5 mg via INTRAVENOUS
  Filled 2015-10-22: qty 1

## 2015-10-22 MED ORDER — PROCHLORPERAZINE EDISYLATE 5 MG/ML IJ SOLN
10.0000 mg | Freq: Once | INTRAMUSCULAR | Status: AC
  Administered 2015-10-22: 10 mg via INTRAVENOUS
  Filled 2015-10-22: qty 2

## 2015-10-22 MED ORDER — BUTALBITAL-APAP-CAFFEINE 50-325-40 MG PO TABS
1.0000 | ORAL_TABLET | Freq: Four times a day (QID) | ORAL | Status: DC | PRN
Start: 2015-10-22 — End: 2016-07-17

## 2015-10-22 NOTE — Discharge Instructions (Signed)
Migraine Headache A migraine headache is an intense, throbbing pain on one or both sides of your head. A migraine can last for 30 minutes to several hours. CAUSES  The exact cause of a migraine headache is not always known. However, a migraine may be caused when nerves in the brain become irritated and release chemicals that cause inflammation. This causes pain. Certain things may also trigger migraines, such as:  Alcohol.  Smoking.  Stress.  Menstruation.  Aged cheeses.  Foods or drinks that contain nitrates, glutamate, aspartame, or tyramine.  Lack of sleep.  Chocolate.  Caffeine.  Hunger.  Physical exertion.  Fatigue.  Medicines used to treat chest pain (nitroglycerine), birth control pills, estrogen, and some blood pressure medicines. SIGNS AND SYMPTOMS  Pain on one or both sides of your head.  Pulsating or throbbing pain.  Severe pain that prevents daily activities.  Pain that is aggravated by any physical activity.  Nausea, vomiting, or both.  Dizziness.  Pain with exposure to bright lights, loud noises, or activity.  General sensitivity to bright lights, loud noises, or smells. Before you get a migraine, you may get warning signs that a migraine is coming (aura). An aura may include:  Seeing flashing lights.  Seeing bright spots, halos, or zigzag lines.  Having tunnel vision or blurred vision.  Having feelings of numbness or tingling.  Having trouble talking.  Having muscle weakness. DIAGNOSIS  A migraine headache is often diagnosed based on:  Symptoms.  Physical exam.  A CT scan or MRI of your head. These imaging tests cannot diagnose migraines, but they can help rule out other causes of headaches. TREATMENT Medicines may be given for pain and nausea. Medicines can also be given to help prevent recurrent migraines.  HOME CARE INSTRUCTIONS  Only take over-the-counter or prescription medicines for pain or discomfort as directed by your  health care provider. The use of long-term narcotics is not recommended.  Lie down in a dark, quiet room when you have a migraine.  Keep a journal to find out what may trigger your migraine headaches. For example, write down:  What you eat and drink.  How much sleep you get.  Any change to your diet or medicines.  Limit alcohol consumption.  Quit smoking if you smoke.  Get 7-9 hours of sleep, or as recommended by your health care provider.  Limit stress.  Keep lights dim if bright lights bother you and make your migraines worse. SEEK IMMEDIATE MEDICAL CARE IF:   Your migraine becomes severe.  You have a fever.  You have a stiff neck.  You have vision loss.  You have muscular weakness or loss of muscle control.  You start losing your balance or have trouble walking.  You feel faint or pass out.  You have severe symptoms that are different from your first symptoms. MAKE SURE YOU:   Understand these instructions.  Will watch your condition.  Will get help right away if you are not doing well or get worse.   This information is not intended to replace advice given to you by your health care provider. Make sure you discuss any questions you have with your health care provider.   Document Released: 08/31/2005 Document Revised: 09/21/2014 Document Reviewed: 05/08/2013 Elsevier Interactive Patient Education 2016 [employer] Chiari Malformation Chiari malformation (CM) is a type of brain abnormality that involves the parts of your brain that are important for balance (cerebellum) and basic body functions (brain stem). Normally, the cerebellum is located  in a space at the back part of the skull, just above the opening for the spinal cord (foramen magnum). With CM, part of the cerebellum is located below the foramen magnum instead. This can cause neck pain, balance problems, and other symptoms. There are five types of CM:  Type I.  This is the most common type. It can  cause cerebrospinal fluid (CSF) to not flow between your brain and spinal cord as it normally should.  This type may not cause symptoms, and it often goes unnoticed.  Type II.  This type is present at birth (congenital), and it usually involves a condition in which the spine does not form properly (spina bifida).  Type II also involves having a larger portion of brain structures move down and push through (herniate) the foramen magnum into the spinal canal.  Type III.  This type is more severe than Types I and II, and it is the least common type.  Type III often occurs with a type of congenital disability in which an opening in the skull causes the cerebellum and brain stem and their coverings to bulge out in a sac (encephalocele).  Type IV.  This is also a severe form of CM.  Part of the cerebellum may be missing, and parts of the spine and skull may be visible.  Type 0.  In this type, the cerebellum does not herniate into or below the foramen magnum. However, abnormal flow of CSF between the brain and spinal cord creates a collection of fluid inside the spinal cord (syrinx). This may cause symptoms. CAUSES CM is most commonly congenital. CM can occur when the skull forms incorrectly and provides less space for the cerebellum than normal. In rare cases, CM may also develop later in life (acquired CM or secondary CM). These cases may be caused by:  Injury.  Infection. Abnormal structure or pressure develops in the brain, and this pushes the cerebellum down into the foramen magnum. RISK FACTORS Congenital CM is more likely to occur in:  Females.  People who have a family history of CM. SYMPTOMS CM may not cause any symptoms. Symptoms may also vary depending on the type and severity of CM. Symptoms may also come and go. The most common symptom is a severe headache in the back of the head. Headache pain may come and go. It may also spread to your neck and shoulders. The pain may  be worse when you cough, sneeze, or strain. Other symptoms include:  Difficulty balancing.  Loss of coordination.  Trouble swallowing or speaking.  Muscle weakness.  Feeling dizzy.  Ringing in the ears.  Fainting.  Trouble sleeping.  Fatigue.  Tingling or burning sensations in the fingers or toes.  Hearing problems.  Vision problems.  Vomiting.  Depression.  Seizures. These are only present in more severe types of CM. DIAGNOSIS This condition may be diagnosed with a medical history and physical exam. This may include tests to check your balance and your memory. You may also have other tests, including:  X-ray.  CT scan.  MRI. TREATMENT Treatment for this condition depends on your symptoms and the type of CM that you have. If you have headaches or neck pain, you may be treated with pain medicine or massage therapy. If you have symptoms of CM that are severe or are getting worse, your health care provider may recommend surgery to control your symptoms and prevent the malformation from getting worse. If you do not have symptoms, you may  not need treatment. HOME CARE INSTRUCTIONS  Take medicines only as directed by your health care provider.  Avoid activities that involve straining and heavy lifting.  Consider joining a CM support group.  Keep all follow-up visits as directed by your health care provider. This is important. SEEK MEDICAL CARE IF:  You have new symptoms.  Your symptoms get worse. SEEK IMMEDIATE MEDICAL CARE IF:  You have seizures that are new or different than before.   This information is not intended to replace advice given to you by your health care provider. Make sure you discuss any questions you have with your health care provider.   Document Released: 08/21/2002 Document Revised: 01/15/2015 Document Reviewed: 05/09/2014 Elsevier Interactive Patient Education 2016 [employer].  Analgesic Rebound Headaches An analgesic rebound  headache is a headache that returns after pain medicine (analgesic) that was taken to treat the initial headache wears off. People who suffer from tension, migraine, or cluster headaches are at risk for developing rebound headaches. Any type of primary headache can return as a rebound headache if you regularly take analgesics more than three times a week. If the cycle of rebound headaches continues, they become chronic daily headaches.  CAUSES Analgesics frequently associated with this problem include common over-the-counter medicines like aspirin, ibuprofen, acetaminophen, sinus relief medicines, and other medicines that contain caffeine. Narcotic pain medicines are also a common cause of rebound headaches.  SIGNS AND SYMPTOMS The symptoms of rebound headaches are the same as the symptoms of your initial headache. Symptoms of specific types of headaches include: Tension headache  Pressure around the head.  Dull, aching head pain.  Pain felt over the front and sides of the head.  Tenderness in the muscles of the head, neck and shoulders. Migraine Headache  Pulsing or throbbing pain on one or both sides of the head.  Severe pain that interferes with daily activities.  Pain that is worsened by physical activity.  Nausea, vomiting, or both.  Pain with exposure to bright light, loud noises, or strong smells.  General sensitivity to bright light, loud noises, or strong smells.  Visual changes.  Numbness of one or both arms. Cluster Headaches  Severe pain that begins in or around one eye or temple.  Redness in the eye on the same side as the pain.  Droopy or swollen eyelid.  One-sided head pain.  Nausea.  Runny nose.  Sweaty, pale facial skin.  Restlessness. DIAGNOSIS  Analgesic rebound headaches are diagnosed by reviewing your medical history. This includes the nature of your initial headaches, as well as the type of pain medicines you have been using to treat your  headaches and how often you take them. TREATMENT Discontinuing frequent use of the analgesic medicine will typically reduce the frequency of the rebound episodes. This may initially worsen your headaches but eventually the pain should become more manageable, less frequent, and less severe.  Seeing a headache specialists may helpful. He or she may be able to help you manage your headaches and to make sure there is not another cause of the headaches. Alternative methods of stress relief such as acupuncture, counseling, biofeedback, and massage may also be helpful. Talk with your health care provider about which alternative treatments might be good for you. HOME CARE INSTRUCTIONS Stopping the regular use of pain medicine can be difficult. Follow your health care provider's instructions carefully. Keep all of your appointments. Avoid triggers that are known to cause your primary headaches. SEEK MEDICAL CARE IF: You continue to  experience headaches after following your health care provider's recommended treatments. SEEK IMMEDIATE MEDICAL CARE IF:  You develop new headache pain.  You develop headache pain that is different than what you have experienced in the past.  You develop numbness or tingling in your arms or legs.  You develop changes in your speech or vision. MAKE SURE YOU:  Understand these instructions.  Will watch your child's condition.  Will get help right away if your child is not doing well or gets worse.   This information is not intended to replace advice given to you by your health care provider. Make sure you discuss any questions you have with your health care provider.   Document Released: 11/21/2003 Document Revised: 09/21/2014 Document Reviewed: 03/16/2013 Elsevier Interactive Patient Education [employer]

## 2015-10-22 NOTE — ED Notes (Signed)
Pt made aware to return if symptoms worsen or if any life threatening symptoms occur.   

## 2015-10-22 NOTE — ED Notes (Signed)
Pt reports persistent headache that began yesterday. States this morning she began to feel dizzy and nauseated. Reports no relief with OTC medications. Denies hx of migraines. Denies vision changes or sensitivity to light.
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
## 2016-06-21 ENCOUNTER — Emergency Department (HOSPITAL_COMMUNITY): Payer: BLUE CROSS/BLUE SHIELD

## 2016-06-21 ENCOUNTER — Observation Stay (HOSPITAL_COMMUNITY)
Admission: EM | Admit: 2016-06-21 | Discharge: 2016-06-22 | Disposition: A | Discharge status: Home / Self Care | Payer: BLUE CROSS/BLUE SHIELD | Attending: Internal Medicine | Admitting: Internal Medicine

## 2016-06-21 ENCOUNTER — Encounter (HOSPITAL_COMMUNITY): Payer: Self-pay | Admitting: *Deleted

## 2016-06-21 DIAGNOSIS — G9340 Encephalopathy, unspecified: Secondary | ICD-10-CM | POA: Diagnosis present

## 2016-06-21 DIAGNOSIS — Z79899 Other long term (current) drug therapy: Secondary | ICD-10-CM | POA: Diagnosis not present

## 2016-06-21 DIAGNOSIS — F419 Anxiety disorder, unspecified: Secondary | ICD-10-CM | POA: Diagnosis not present

## 2016-06-21 DIAGNOSIS — Z87891 Personal history of nicotine dependence: Secondary | ICD-10-CM | POA: Insufficient documentation

## 2016-06-21 DIAGNOSIS — T68XXXA Hypothermia, initial encounter: Secondary | ICD-10-CM | POA: Insufficient documentation

## 2016-06-21 DIAGNOSIS — R519 Headache, unspecified: Secondary | ICD-10-CM | POA: Diagnosis present

## 2016-06-21 DIAGNOSIS — F10929 Alcohol use, unspecified with intoxication, unspecified: Secondary | ICD-10-CM

## 2016-06-21 DIAGNOSIS — R51 Headache: Secondary | ICD-10-CM | POA: Insufficient documentation

## 2016-06-21 DIAGNOSIS — F10129 Alcohol abuse with intoxication, unspecified: Secondary | ICD-10-CM | POA: Diagnosis not present

## 2016-06-21 DIAGNOSIS — Y907 Blood alcohol level of 200-239 mg/100 ml: Secondary | ICD-10-CM | POA: Diagnosis not present

## 2016-06-21 DIAGNOSIS — R4189 Other symptoms and signs involving cognitive functions and awareness: Secondary | ICD-10-CM

## 2016-06-21 HISTORY — DX: Anxiety disorder, unspecified: F41.9

## 2016-06-21 HISTORY — DX: Headache: R51

## 2016-06-21 HISTORY — DX: Headache, unspecified: R51.9

## 2016-06-21 LAB — URINE MICROSCOPIC-ADD ON: WBC, UA: NONE SEEN WBC/hpf (ref 0–5)

## 2016-06-21 LAB — CBC WITH DIFFERENTIAL/PLATELET
BASOS ABS: 0 10*3/uL (ref 0.0–0.1)
BASOS PCT: 0 %
EOS PCT: 3 %
Eosinophils Absolute: 0.3 10*3/uL (ref 0.0–0.7)
HCT: 41.2 % (ref 36.0–46.0)
Hemoglobin: 14.2 g/dL (ref 12.0–15.0)
LYMPHS ABS: 4 10*3/uL (ref 0.7–4.0)
LYMPHS PCT: 44 %
MCH: 31.9 pg (ref 26.0–34.0)
MCHC: 34.5 g/dL (ref 30.0–36.0)
MCV: 92.6 fL (ref 78.0–100.0)
MONO ABS: 0.6 10*3/uL (ref 0.1–1.0)
Monocytes Relative: 7 %
NEUTROS ABS: 4.2 10*3/uL (ref 1.7–7.7)
Neutrophils Relative %: 46 %
PLATELETS: 262 10*3/uL (ref 150–400)
RBC: 4.45 MIL/uL (ref 3.87–5.11)
RDW: 12.5 % (ref 11.5–15.5)
WBC: 9.1 10*3/uL (ref 4.0–10.5)

## 2016-06-21 LAB — URINALYSIS, ROUTINE W REFLEX MICROSCOPIC
Bilirubin Urine: NEGATIVE
GLUCOSE, UA: NEGATIVE mg/dL
Ketones, ur: NEGATIVE mg/dL
Leukocytes, UA: NEGATIVE
Nitrite: NEGATIVE
Protein, ur: NEGATIVE mg/dL
pH: 6 (ref 5.0–8.0)

## 2016-06-21 LAB — COMPREHENSIVE METABOLIC PANEL
ALBUMIN: 4.7 g/dL (ref 3.5–5.0)
ALT: 18 U/L (ref 14–54)
AST: 21 U/L (ref 15–41)
Alkaline Phosphatase: 53 U/L (ref 38–126)
Anion gap: 7 (ref 5–15)
BUN: 9 mg/dL (ref 6–20)
CHLORIDE: 110 mmol/L (ref 101–111)
CO2: 25 mmol/L (ref 22–32)
Calcium: 9.8 mg/dL (ref 8.9–10.3)
Creatinine, Ser: 0.85 mg/dL (ref 0.44–1.00)
GFR calc Af Amer: >60 mL/min (ref >60)
GFR calc non Af Amer: >60 mL/min (ref >60)
GLUCOSE: 84 mg/dL (ref 65–99)
POTASSIUM: 3.7 mmol/L (ref 3.5–5.1)
Sodium: 142 mmol/L (ref 135–145)
Total Bilirubin: 0.5 mg/dL (ref 0.3–1.2)
Total Protein: 7.4 g/dL (ref 6.5–8.1)

## 2016-06-21 LAB — RAPID URINE DRUG SCREEN, HOSP PERFORMED
AMPHETAMINES: NOT DETECTED
Barbiturates: NOT DETECTED
Benzodiazepines: POSITIVE — AB
Cocaine: NOT DETECTED
OPIATES: NOT DETECTED
TETRAHYDROCANNABINOL: NOT DETECTED

## 2016-06-21 LAB — PREGNANCY, URINE: PREG TEST UR: NEGATIVE

## 2016-06-21 LAB — ETHANOL: Alcohol, Ethyl (B): 234 mg/dL — ABNORMAL HIGH (ref <5)

## 2016-06-21 MED ORDER — SODIUM CHLORIDE 0.9 % IV BOLUS (SEPSIS)
1000.0000 mL | Freq: Once | INTRAVENOUS | Status: AC
  Administered 2016-06-21: 1000 mL via INTRAVENOUS

## 2016-06-21 MED ORDER — AMMONIA AROMATIC IN INHA
RESPIRATORY_TRACT | Status: AC
  Filled 2016-06-21: qty 10
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
## 2016-06-21 NOTE — ED Notes (Signed)
Warm blankets applied per V.O Dr Blinda Leatherwood
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
## 2016-06-22 ENCOUNTER — Encounter (HOSPITAL_COMMUNITY): Payer: Self-pay | Admitting: Family Medicine

## 2016-06-22 DIAGNOSIS — F10929 Alcohol use, unspecified with intoxication, unspecified: Secondary | ICD-10-CM | POA: Diagnosis not present

## 2016-06-22 DIAGNOSIS — R4189 Other symptoms and signs involving cognitive functions and awareness: Secondary | ICD-10-CM

## 2016-06-22 DIAGNOSIS — R519 Headache, unspecified: Secondary | ICD-10-CM | POA: Diagnosis present

## 2016-06-22 DIAGNOSIS — F419 Anxiety disorder, unspecified: Secondary | ICD-10-CM | POA: Diagnosis present

## 2016-06-22 DIAGNOSIS — G9340 Encephalopathy, unspecified: Secondary | ICD-10-CM | POA: Diagnosis present

## 2016-06-22 DIAGNOSIS — F10921 Alcohol use, unspecified with intoxication delirium: Secondary | ICD-10-CM

## 2016-06-22 DIAGNOSIS — R51 Headache: Secondary | ICD-10-CM

## 2016-06-22 DIAGNOSIS — T68XXXA Hypothermia, initial encounter: Secondary | ICD-10-CM

## 2016-06-22 LAB — GLUCOSE, CAPILLARY
GLUCOSE-CAPILLARY: 80 mg/dL (ref 65–99)
Glucose-Capillary: 86 mg/dL (ref 65–99)
Glucose-Capillary: 82 mg/dL (ref 65–99)

## 2016-06-22 LAB — TROPONIN I

## 2016-06-22 LAB — MRSA PCR SCREENING: MRSA by PCR: NEGATIVE

## 2016-06-22 LAB — LACTIC ACID, PLASMA
Lactic Acid, Venous: 1.8 mmol/L (ref 0.5–1.9)
Lactic Acid, Venous: 1.7 mmol/L (ref 0.5–1.9)

## 2016-06-22 LAB — ACETAMINOPHEN LEVEL

## 2016-06-22 LAB — TSH: TSH: 0.305 u[IU]/mL — ABNORMAL LOW (ref 0.350–4.500)

## 2016-06-22 LAB — SALICYLATE LEVEL: Salicylate Lvl: <7 mg/dL (ref 2.8–30.0)

## 2016-06-22 MED ORDER — ENOXAPARIN SODIUM 40 MG/0.4ML ~~LOC~~ SOLN
40.0000 mg | SUBCUTANEOUS | Status: DC
  Administered 2016-06-22: 40 mg via SUBCUTANEOUS
  Filled 2016-06-22: qty 0.4

## 2016-06-22 MED ORDER — POLYETHYLENE GLYCOL 3350 17 G PO PACK: 17.0000 g | PACK | Freq: Every day | ORAL | Status: DC | PRN

## 2016-06-22 MED ORDER — SODIUM CHLORIDE 0.9% FLUSH: 3.0000 mL | Freq: Two times a day (BID) | INTRAVENOUS | Status: DC

## 2016-06-22 MED ORDER — ONDANSETRON HCL 4 MG/2ML IJ SOLN: 4.0000 mg | Freq: Four times a day (QID) | INTRAMUSCULAR | Status: DC | PRN

## 2016-06-22 MED ORDER — ONDANSETRON HCL 4 MG PO TABS: 4.0000 mg | ORAL_TABLET | Freq: Four times a day (QID) | ORAL | Status: DC | PRN

## 2016-06-22 MED ORDER — ACETAMINOPHEN 650 MG RE SUPP: 650.0000 mg | Freq: Four times a day (QID) | RECTAL | Status: DC | PRN

## 2016-06-22 MED ORDER — LORAZEPAM 2 MG/ML IJ SOLN: 2.0000 mg | INTRAMUSCULAR | Status: DC | PRN

## 2016-06-22 MED ORDER — KETOROLAC TROMETHAMINE 30 MG/ML IJ SOLN: 30.0000 mg | Freq: Four times a day (QID) | INTRAMUSCULAR | Status: DC | PRN

## 2016-06-22 MED ORDER — ACETAMINOPHEN 325 MG PO TABS: 650.0000 mg | ORAL_TABLET | Freq: Four times a day (QID) | ORAL | Status: DC | PRN

## 2016-06-22 MED ORDER — SODIUM CHLORIDE 0.9 % IV SOLN
INTRAVENOUS | Status: AC
  Administered 2016-06-22: 03:00:00 via INTRAVENOUS

## 2016-06-22 NOTE — H&P (Signed)
History and Physical    Valerie Rose:[id number] DOB: 05-26-1991 DOA: 06/21/2016  PCP: Estanislado Pandy, MD   Patient coming from: Home  Chief Complaint: Unresponsive after fall at home  HPI: Valerie Rose is a 25 y.o. female with medical history significant for anxiety and chronic daily headache who presents the emergency department with unresponsiveness after a fall at home. Patient is unable to provide a history, and history is therefore obtained through discussion with patient's family, patient's friend was present during the episode, ED personnel, and review of the EMR. Patient had reportedly been in her usual state of health with stable anxiety and stable chronic daily headaches. She had just moved into a new house today and was reportedly celebrating with friends. She reportedly drank 4 bottles of wine, shared with one other person. She reportedly is prescribed Xanax for anxiety and has been taking that as well. Patient's friend describes seeing Ms. Muramoto staggering, and then following. Patient would not wake up and so she was carried out to a private vehicle and drove into the hospital. The patient had not voiced any complaints earlier today or yesterday, was not noted to have any cough, and was not seen vomiting.   ED Course: Upon arrival to the ED, patient is found to be hypothermic to 34.9 C, saturating well on room air, tachycardic to 110, and with vitals otherwise stable. EKG demonstrates sinus tachycardia with rate 105 and is otherwise normal. Chest x-ray is negative for acute cardiopulmonary disease. CMP is entirely within the normal limits, as is CBC. Ethanol level was elevated to 234 and UDS is positive for benzodiazepines only. Urinalysis is notable for a low specific gravity but is not suggestive of infection. CT of the head is negative for acute intracranial abnormality and cervical spine CT is negative for acute injury. Patient was given 1 L of normal saline in the emergency  department with resolution of the tachycardia. Warming blankets were applied. Patient remained hemodynamically stable in the emergency department and in no apparent respiratory distress. She will be observed on the stepdown unit for ongoing evaluation and management of acute encephalopathy with hypothermia suspected secondary to acute alcohol intoxication, but with infectious etiology, or even possibly seizure not yet excluded.   Review of Systems:  Unable to obtain ROS secondary to the patient's clinical condition with acute encephalopathy.  Past Medical History:  Diagnosis Date  . Anxiety   . Chronic daily headache     Past Surgical History:  Procedure Laterality Date  . CESAREAN SECTION    . I&D EXTREMITY Left 03/04/2014   Procedure: IRRIGATION AND DEBRIDEMENT EXTREMITY, REPAIR OF MULTIPLE LACERATIONS, HIP AND KNEE;  Surgeon: Cammy Copa, MD;  Location: [facility] OR;  Service: Orthopedics;  Laterality: Left;  Wounds located at left hip and knee.  . I&D EXTREMITY Left 03/06/2014   Procedure: IRRIGATION AND DEBRIDEMENT EXTREMITY WITH ABX BEAD PLACEMENT WITH DELAYED PRIMARY CLOSURE.;  Surgeon: Cammy Copa, MD;  Location: [facility] OR;  Service: Orthopedics;  Laterality: Left;  . LEG SURGERY       reports that she has quit smoking. She has never used smokeless tobacco. She reports that she does not drink alcohol or use drugs.  No Known Allergies  History reviewed. No pertinent family history.   Prior to Admission medications   Medication Sig Start Date End Date Taking? Authorizing Provider  aspirin-acetaminophen-caffeine (EXCEDRIN MIGRAINE) [phone] MG tablet Take 1-2 tablets by mouth every 6 (six) hours as needed for headache.  Historical Provider, MD  butalbital-acetaminophen-caffeine (FIORICET) 50-325-40 MG tablet Take 1-2 tablets by mouth every 6 (six) hours as needed for headache. 10/22/15 10/21/16  Linwood Dibbles, MD    Physical Exam: Vitals:   06/21/16 2317 06/21/16 2330 06/22/16  0000  BP: 130/81 119/81 115/74  Pulse: 110  84  Resp: 20 26 22   Temp: (!) 94.8 F (34.9 C)    TempSrc: Rectal    SpO2: 100%  98%      Constitutional: NAD, calm, comfortable Eyes: PERTLA, lids and conjunctivae normal ENMT: Mucous membranes are dry. Posterior pharynx clear of any exudate or lesions.   Neck: normal, supple, no masses, no thyromegaly Respiratory: clear to auscultation bilaterally, no wheezing, no crackles. Normal respiratory effort.   Cardiovascular: S1 & S2 heard, regular rate and rhythm, no significant murmurs. No extremity edema. 2+ pedal pulses. No carotid bruits. No significant JVD. Abdomen: No distension, no tenderness, no masses palpated. Bowel sounds normal.  Musculoskeletal: no clubbing / cyanosis. No joint deformity upper and lower extremities. Normal muscle tone.  Skin: no significant rashes, lesions, ulcers. Warm, dry, well-perfused. Neurologic: CN 2-12 grossly intact. Patellar DTR's normal b/l. Babinski down-going bilaterally. Withdraws from pain, but no spontaneous eye-opening or verbal responses.  Psychiatric: Difficult to assess given the clinical scenario.     Labs on Admission: I have personally reviewed following labs and imaging studies  CBC:  Recent Labs Lab 06/21/16 2307  WBC 9.1  NEUTROABS 4.2  HGB 14.2  HCT 41.2  MCV 92.6  PLT 262   Basic Metabolic Panel:  Recent Labs Lab 06/21/16 2307  NA 142  K 3.7  CL 110  CO2 25  GLUCOSE 84  BUN 9  CREATININE 0.85  CALCIUM 9.8   GFR: CrCl cannot be calculated (Unknown ideal weight.). Liver Function Tests:  Recent Labs Lab 06/21/16 2307  AST 21  ALT 18  ALKPHOS 53  BILITOT 0.5  PROT 7.4  ALBUMIN 4.7   No results for input(s): LIPASE, AMYLASE in the last 168 hours. No results for input(s): AMMONIA in the last 168 hours. Coagulation Profile: No results for input(s): INR, PROTIME in the last 168 hours. Cardiac Enzymes: No results for input(s): CKTOTAL, CKMB, CKMBINDEX,  TROPONINI in the last 168 hours. BNP (last 3 results) No results for input(s): PROBNP in the last 8760 hours. HbA1C: No results for input(s): HGBA1C in the last 72 hours. CBG: No results for input(s): GLUCAP in the last 168 hours. Lipid Profile: No results for input(s): CHOL, HDL, LDLCALC, TRIG, CHOLHDL, LDLDIRECT in the last 72 hours. Thyroid Function Tests: No results for input(s): TSH, T4TOTAL, FREET4, T3FREE, THYROIDAB in the last 72 hours. Anemia Panel: No results for input(s): VITAMINB12, FOLATE, FERRITIN, TIBC, IRON, RETICCTPCT in the last 72 hours. Urine analysis:    Component Value Date/Time   COLORURINE YELLOW 06/21/2016 2307   APPEARANCEUR CLEAR 06/21/2016 2307   LABSPEC <1.005 (L) 06/21/2016 2307   PHURINE 6.0 06/21/2016 2307   GLUCOSEU NEGATIVE 06/21/2016 2307   HGBUR SMALL (A) 06/21/2016 2307   BILIRUBINUR NEGATIVE 06/21/2016 2307   KETONESUR NEGATIVE 06/21/2016 2307   PROTEINUR NEGATIVE 06/21/2016 2307   UROBILINOGEN 0.2 03/04/2014 0402   NITRITE NEGATIVE 06/21/2016 2307   LEUKOCYTESUR NEGATIVE 06/21/2016 2307   Sepsis Labs: @LABRCNTIP (procalcitonin:4,lacticidven:4) )No results found for this or any previous visit (from the past 240 hour(s)).   Radiological Exams on Admission: Dg Chest 1 View  Result Date: 06/22/2016 CLINICAL DATA:  Unresponsive patient. EXAM: CHEST 1 VIEW COMPARISON:  Chest  CT 03/04/2014 FINDINGS: Cardiomediastinal contours are normal. No pneumothorax or pleural effusion. No focal airspace consolidation or pulmonary edema. IMPRESSION: Clear lungs. Electronically Signed   By: Deatra Robinson M.D.   On: 06/22/2016 00:38   Ct Head Wo Contrast  Result Date: 06/22/2016 CLINICAL DATA:  Status post fall. Patient found unresponsive. Concern for head or cervical spine injury. Initial encounter. EXAM: CT HEAD WITHOUT CONTRAST CT CERVICAL SPINE WITHOUT CONTRAST TECHNIQUE: Multidetector CT imaging of the head and cervical spine was performed following the  standard protocol without intravenous contrast. Multiplanar CT image reconstructions of the cervical spine were also generated. COMPARISON:  CT of the head performed 10/22/2015, and CT of the cervical spine performed 03/04/2014 FINDINGS: CT HEAD FINDINGS Brain: No evidence of acute infarction, hemorrhage, hydrocephalus, extra-axial collection or mass lesion/mass effect. The posterior fossa, including the cerebellum, brainstem and fourth ventricle, is within normal limits. The third and lateral ventricles, and basal ganglia are unremarkable in appearance. The cerebral hemispheres are symmetric in appearance, with normal gray-white differentiation. No mass effect or midline shift is seen. Vascular: No hyperdense vessel or unexpected calcification. Skull: There is no evidence of fracture; visualized osseous structures are unremarkable in appearance. Sinuses/Orbits: The visualized portions of the orbits are within normal limits. The paranasal sinuses and mastoid air cells are well-aerated. Other: No significant soft tissue abnormalities are seen. CT CERVICAL SPINE FINDINGS Alignment: Normal. Skull base and vertebrae: No acute fracture. No primary bone lesion or focal pathologic process. Soft tissues and spinal canal: No prevertebral fluid or swelling. No visible canal hematoma. Disc levels: Intervertebral disc spaces are preserved. The bony foramina are grossly unremarkable in appearance. Upper chest: The visualized lung apices are grossly clear. The thyroid gland is unremarkable in appearance. Other: No additional soft tissue abnormalities are seen. IMPRESSION: 1. No evidence of traumatic intracranial injury or fracture. 2. No evidence of fracture or subluxation along the cervical spine. Electronically Signed   By: Roanna Raider M.D.   On: 06/22/2016 00:33   Ct Cervical Spine Wo Contrast  Result Date: 06/22/2016 CLINICAL DATA:  Status post fall. Patient found unresponsive. Concern for head or cervical spine  injury. Initial encounter. EXAM: CT HEAD WITHOUT CONTRAST CT CERVICAL SPINE WITHOUT CONTRAST TECHNIQUE: Multidetector CT imaging of the head and cervical spine was performed following the standard protocol without intravenous contrast. Multiplanar CT image reconstructions of the cervical spine were also generated. COMPARISON:  CT of the head performed 10/22/2015, and CT of the cervical spine performed 03/04/2014 FINDINGS: CT HEAD FINDINGS Brain: No evidence of acute infarction, hemorrhage, hydrocephalus, extra-axial collection or mass lesion/mass effect. The posterior fossa, including the cerebellum, brainstem and fourth ventricle, is within normal limits. The third and lateral ventricles, and basal ganglia are unremarkable in appearance. The cerebral hemispheres are symmetric in appearance, with normal gray-white differentiation. No mass effect or midline shift is seen. Vascular: No hyperdense vessel or unexpected calcification. Skull: There is no evidence of fracture; visualized osseous structures are unremarkable in appearance. Sinuses/Orbits: The visualized portions of the orbits are within normal limits. The paranasal sinuses and mastoid air cells are well-aerated. Other: No significant soft tissue abnormalities are seen. CT CERVICAL SPINE FINDINGS Alignment: Normal. Skull base and vertebrae: No acute fracture. No primary bone lesion or focal pathologic process. Soft tissues and spinal canal: No prevertebral fluid or swelling. No visible canal hematoma. Disc levels: Intervertebral disc spaces are preserved. The bony foramina are grossly unremarkable in appearance. Upper chest: The visualized lung apices are grossly clear. The  thyroid gland is unremarkable in appearance. Other: No additional soft tissue abnormalities are seen. IMPRESSION: 1. No evidence of traumatic intracranial injury or fracture. 2. No evidence of fracture or subluxation along the cervical spine. Electronically Signed   By: Roanna RaiderJeffery  Chang M.D.    On: 06/22/2016 00:33    EKG: Independently reviewed. Sinus tachycardia (rate 105)   Assessment/Plan  1. Acute encephalopathy  - Head CT neg for acute pathology; basic labs wnl; EtOH level 234; UDS positive for benzodiazepines (pt's mother reports that she is prescribed Xanax) - Likely secondary to acute intoxication and recovery is anticipated over the next 24 hours  - Given the hypothermia, an infectious etiology was considered, but seems unlikely; obtain cultures - Seizure considered, but no convulsive activity observed and no hx of such  - If fails to improve in the coming hours as expected, may need to extend workup   2. Hypothermia   - Rectal temp 34.9 C on arrival; has since normalized  - Likely secondary to alcohol intoxication; glucose wnl; head CT negative  - Cultures collected; no leukocytosis, normal lactate, UA not suggestive of infxn, and CXR clear  - Monitor   3. Acute alcohol intoxication - EtOH level 234 on arrival  - UDS positive for benzodiazepines only; APAP and salicylates undetectable  - Counsel regarding safe consumption when she wakes up  4. Anxiety  - Managed with Xanax at home; unable to assess on admission given her clinical condition; seemingly stable per family report   5. Chronic daily headaches - Pt has been under the care of outpatient neurology per family report  - Head CT negative for acute findings     DVT prophylaxis: sq Lovenox  Code Status: Full   Family Communication: Mother and sister updated at bedside insofar as allowed by HIPAA Disposition Plan: Observe in stepdown unit Consults called: None Admission status: Observation     Briscoe Deutscherimothy S Anum Palecek, MD [facility] Pager [phone]  If 7PM-7AM, please contact night-coverage [email or web address] Password [email or web address]  06/22/2016, 1:36 AM

## 2016-06-22 NOTE — Discharge Summary (Addendum)
Physician Discharge Summary  MARELLY WEHRMAN ZOX:[id number] DOB: 04-28-1991 DOA: 06/21/2016  PCP: Estanislado Pandy, MD  Admit date: 06/21/2016 Discharge date: 06/22/2016  Time spent: 45 minutes  Recommendations for Outpatient Follow-up:  -We'll be discharge home today. -We'll need follow-up with PCP in 2 weeks.   Discharge Diagnoses:  Principal Problem:   Acute encephalopathy Active Problems:   Acute alcohol intoxication (HCC)   Hypothermia   Anxiety   Chronic daily headache   Unresponsive   Discharge Condition: Stable and improved  Filed Weights   06/22/16 0300  Weight: 64.6 kg (142 lb 6.7 oz)    History of present illness:  As per Dr. Antionette Char on 10/9: Valerie Rose is a 25 y.o. female with medical history significant for anxiety and chronic daily headache who presents the emergency department with unresponsiveness after a fall at home. Patient is unable to provide a history, and history is therefore obtained through discussion with patient's family, patient's friend was present during the episode, ED personnel, and review of the EMR. Patient had reportedly been in her usual state of health with stable anxiety and stable chronic daily headaches. She had just moved into a new house today and was reportedly celebrating with friends. She reportedly drank 4 bottles of wine, shared with one other person. She reportedly is prescribed Xanax for anxiety and has been taking that as well. Patient's friend describes seeing Ms. Goldner staggering, and then following. Patient would not wake up and so she was carried out to a private vehicle and drove into the hospital. The patient had not voiced any complaints earlier today or yesterday, was not noted to have any cough, and was not seen vomiting.   ED Course: Upon arrival to the ED, patient is found to be hypothermic to 34.9 C, saturating well on room air, tachycardic to 110, and with vitals otherwise stable. EKG demonstrates sinus tachycardia  with rate 105 and is otherwise normal. Chest x-ray is negative for acute cardiopulmonary disease. CMP is entirely within the normal limits, as is CBC. Ethanol level was elevated to 234 and UDS is positive for benzodiazepines only. Urinalysis is notable for a low specific gravity but is not suggestive of infection. CT of the head is negative for acute intracranial abnormality and cervical spine CT is negative for acute injury. Patient was given 1 L of normal saline in the emergency department with resolution of the tachycardia. Warming blankets were applied. Patient remained hemodynamically stable in the emergency department and in no apparent respiratory distress. She will be observed on the stepdown unit for ongoing evaluation and management of acute encephalopathy with hypothermia suspected secondary to acute alcohol intoxication, but with infectious etiology, or even possibly seizure not yet excluded.  Hospital Course:   Acute encephalopathy -Due to acute alcohol intoxication, EtOH level on admission was 234, UDS was positive for benzodiazepines however she is prescribed these medications. -CT scan of the head was negative, lab work within normal limits. -She is starting to wake up, anticipate discharge home later today.  Procedures:  None   Consultations:  None  Discharge Instructions  Discharge Instructions    Increase activity slowly    Complete by:  As directed        Medication List    TAKE these medications   ALPRAZolam 0.5 MG tablet Commonly known as:  XANAX Take 1 tablet by mouth daily as needed for anxiety.   aspirin-acetaminophen-caffeine 250-250-65 MG tablet Commonly known as:  EXCEDRIN MIGRAINE Take 1-2  tablets by mouth every 6 (six) hours as needed for headache.   butalbital-acetaminophen-caffeine 50-325-40 MG tablet Commonly known as:  FIORICET Take 1-2 tablets by mouth every 6 (six) hours as needed for headache.   sertraline 100 MG tablet Commonly known as:   ZOLOFT Take 1 tablet by mouth daily.      No Known Allergies Follow-up Information    Estanislado Pandy, MD. Schedule an appointment as soon as possible for a visit in 2 week(s).   Specialty:  Family Medicine Contact information: [address] [place] [address] [phone]            The results of significant diagnostics from this hospitalization (including imaging, microbiology, ancillary and laboratory) are listed below for reference.    Significant Diagnostic Studies: Dg Chest 1 View  Result Date: 06/22/2016 CLINICAL DATA:  Unresponsive patient. EXAM: CHEST 1 VIEW COMPARISON:  Chest CT 03/04/2014 FINDINGS: Cardiomediastinal contours are normal. No pneumothorax or pleural effusion. No focal airspace consolidation or pulmonary edema. IMPRESSION: Clear lungs. Electronically Signed   By: Deatra Robinson M.D.   On: 06/22/2016 00:38   Ct Head Wo Contrast  Result Date: 06/22/2016 CLINICAL DATA:  Status post fall. Patient found unresponsive. Concern for head or cervical spine injury. Initial encounter. EXAM: CT HEAD WITHOUT CONTRAST CT CERVICAL SPINE WITHOUT CONTRAST TECHNIQUE: Multidetector CT imaging of the head and cervical spine was performed following the standard protocol without intravenous contrast. Multiplanar CT image reconstructions of the cervical spine were also generated. COMPARISON:  CT of the head performed 10/22/2015, and CT of the cervical spine performed 03/04/2014 FINDINGS: CT HEAD FINDINGS Brain: No evidence of acute infarction, hemorrhage, hydrocephalus, extra-axial collection or mass lesion/mass effect. The posterior fossa, including the cerebellum, brainstem and fourth ventricle, is within normal limits. The third and lateral ventricles, and basal ganglia are unremarkable in appearance. The cerebral hemispheres are symmetric in appearance, with normal gray-white differentiation. No mass effect or midline shift is seen. Vascular: No hyperdense vessel or unexpected  calcification. Skull: There is no evidence of fracture; visualized osseous structures are unremarkable in appearance. Sinuses/Orbits: The visualized portions of the orbits are within normal limits. The paranasal sinuses and mastoid air cells are well-aerated. Other: No significant soft tissue abnormalities are seen. CT CERVICAL SPINE FINDINGS Alignment: Normal. Skull base and vertebrae: No acute fracture. No primary bone lesion or focal pathologic process. Soft tissues and spinal canal: No prevertebral fluid or swelling. No visible canal hematoma. Disc levels: Intervertebral disc spaces are preserved. The bony foramina are grossly unremarkable in appearance. Upper chest: The visualized lung apices are grossly clear. The thyroid gland is unremarkable in appearance. Other: No additional soft tissue abnormalities are seen. IMPRESSION: 1. No evidence of traumatic intracranial injury or fracture. 2. No evidence of fracture or subluxation along the cervical spine. Electronically Signed   By: Roanna Raider M.D.   On: 06/22/2016 00:33   Ct Cervical Spine Wo Contrast  Result Date: 06/22/2016 CLINICAL DATA:  Status post fall. Patient found unresponsive. Concern for head or cervical spine injury. Initial encounter. EXAM: CT HEAD WITHOUT CONTRAST CT CERVICAL SPINE WITHOUT CONTRAST TECHNIQUE: Multidetector CT imaging of the head and cervical spine was performed following the standard protocol without intravenous contrast. Multiplanar CT image reconstructions of the cervical spine were also generated. COMPARISON:  CT of the head performed 10/22/2015, and CT of the cervical spine performed 03/04/2014 FINDINGS: CT HEAD FINDINGS Brain: No evidence of acute infarction, hemorrhage, hydrocephalus, extra-axial collection or mass lesion/mass effect. The  posterior fossa, including the cerebellum, brainstem and fourth ventricle, is within normal limits. The third and lateral ventricles, and basal ganglia are unremarkable in  appearance. The cerebral hemispheres are symmetric in appearance, with normal gray-white differentiation. No mass effect or midline shift is seen. Vascular: No hyperdense vessel or unexpected calcification. Skull: There is no evidence of fracture; visualized osseous structures are unremarkable in appearance. Sinuses/Orbits: The visualized portions of the orbits are within normal limits. The paranasal sinuses and mastoid air cells are well-aerated. Other: No significant soft tissue abnormalities are seen. CT CERVICAL SPINE FINDINGS Alignment: Normal. Skull base and vertebrae: No acute fracture. No primary bone lesion or focal pathologic process. Soft tissues and spinal canal: No prevertebral fluid or swelling. No visible canal hematoma. Disc levels: Intervertebral disc spaces are preserved. The bony foramina are grossly unremarkable in appearance. Upper chest: The visualized lung apices are grossly clear. The thyroid gland is unremarkable in appearance. Other: No additional soft tissue abnormalities are seen. IMPRESSION: 1. No evidence of traumatic intracranial injury or fracture. 2. No evidence of fracture or subluxation along the cervical spine. Electronically Signed   By: Roanna RaiderJeffery  Chang M.D.   On: 06/22/2016 00:33    Microbiology: Recent Results (from the past 240 hour(s))  Culture, blood (routine x 2)     Status: None (Preliminary result)   Collection Time: 06/22/16  1:51 AM  Result Value Ref Range Status   Specimen Description LEFT ANTECUBITAL  Final   Special Requests BOTTLES DRAWN AEROBIC AND ANAEROBIC 6CC  Final   Culture NO GROWTH < 12 HOURS  Final   Report Status PENDING  Incomplete  Culture, blood (routine x 2)     Status: None (Preliminary result)   Collection Time: 06/22/16  2:03 AM  Result Value Ref Range Status   Specimen Description LEFT ANTECUBITAL  Final   Special Requests BOTTLES DRAWN AEROBIC AND ANAEROBIC 6CC  Final   Culture NO GROWTH < 12 HOURS  Final   Report Status PENDING   Incomplete  MRSA PCR Screening     Status: None   Collection Time: 06/22/16  3:10 AM  Result Value Ref Range Status   MRSA by PCR NEGATIVE NEGATIVE Final    Comment:        The GeneXpert MRSA Assay (FDA approved for NASAL specimens only), is one component of a comprehensive MRSA colonization surveillance program. It is not intended to diagnose MRSA infection nor to guide or monitor treatment for MRSA infections.      Labs: Basic Metabolic Panel:  Recent Labs Lab 06/21/16 2307  NA 142  K 3.7  CL 110  CO2 25  GLUCOSE 84  BUN 9  CREATININE 0.85  CALCIUM 9.8   Liver Function Tests:  Recent Labs Lab 06/21/16 2307  AST 21  ALT 18  ALKPHOS 53  BILITOT 0.5  PROT 7.4  ALBUMIN 4.7   No results for input(s): LIPASE, AMYLASE in the last 168 hours. No results for input(s): AMMONIA in the last 168 hours. CBC:  Recent Labs Lab 06/21/16 2307  WBC 9.1  NEUTROABS 4.2  HGB 14.2  HCT 41.2  MCV 92.6  PLT 262   Cardiac Enzymes:  Recent Labs Lab 06/22/16 0150  TROPONINI <0.03   BNP: BNP (last 3 results) No results for input(s): BNP in the last 8760 hours.  ProBNP (last 3 results) No results for input(s): PROBNP in the last 8760 hours.  CBG:  Recent Labs Lab 06/22/16 0412 06/22/16 0730 06/22/16 1117  GLUCAP  86 80 82       Signed:  HERNANDEZ ACOSTA,ESTELA  [facility] Pager: [phone] 06/22/2016, 1:12 PM

## 2016-06-22 NOTE — Progress Notes (Signed)
D/c instructions reviewed with patient and boyfriend. Verbalized understanding. Pt dc'd to home with boyfriend.

## 2016-06-27 LAB — CULTURE, BLOOD (ROUTINE X 2)
CULTURE: NO GROWTH
Culture: NO GROWTH

## 2016-07-01 ENCOUNTER — Ambulatory Visit (HOSPITAL_COMMUNITY): Payer: Self-pay | Admitting: Psychiatry

## 2016-07-17 ENCOUNTER — Ambulatory Visit (INDEPENDENT_AMBULATORY_CARE_PROVIDER_SITE_OTHER): Payer: BLUE CROSS/BLUE SHIELD | Admitting: Psychiatry

## 2016-07-17 ENCOUNTER — Encounter (HOSPITAL_COMMUNITY): Payer: Self-pay | Admitting: Psychiatry

## 2016-07-17 VITALS — BP 111/61 | HR 71 | Ht 64.0 in | Wt 146.0 lb

## 2016-07-17 DIAGNOSIS — Z79899 Other long term (current) drug therapy: Secondary | ICD-10-CM

## 2016-07-17 DIAGNOSIS — F17210 Nicotine dependence, cigarettes, uncomplicated: Secondary | ICD-10-CM

## 2016-07-17 DIAGNOSIS — F4310 Post-traumatic stress disorder, unspecified: Secondary | ICD-10-CM | POA: Diagnosis not present

## 2016-07-17 DIAGNOSIS — R45851 Suicidal ideations: Secondary | ICD-10-CM | POA: Diagnosis not present

## 2016-07-17 MED ORDER — ALPRAZOLAM 1 MG PO TABS: 1.0000 mg | ORAL_TABLET | Freq: Two times a day (BID) | ORAL | 2 refills | Status: DC | PRN

## 2016-07-17 MED ORDER — FLUOXETINE HCL 20 MG PO CAPS: 20.0000 mg | ORAL_CAPSULE | Freq: Every day | ORAL | 2 refills | Status: DC
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
## 2016-08-27 ENCOUNTER — Encounter (HOSPITAL_COMMUNITY): Payer: Self-pay

## 2016-08-27 ENCOUNTER — Ambulatory Visit (HOSPITAL_COMMUNITY): Payer: Self-pay | Admitting: Psychiatry

## 2016-09-23 ENCOUNTER — Other Ambulatory Visit (HOSPITAL_COMMUNITY): Payer: Self-pay | Admitting: Psychiatry

## 2016-09-29 ENCOUNTER — Encounter (HOSPITAL_COMMUNITY): Payer: Self-pay | Admitting: Psychiatry

## 2016-09-29 ENCOUNTER — Ambulatory Visit (INDEPENDENT_AMBULATORY_CARE_PROVIDER_SITE_OTHER): Payer: BLUE CROSS/BLUE SHIELD | Admitting: Psychiatry

## 2016-09-29 VITALS — BP 130/72 | HR 65 | Ht 64.0 in | Wt 142.8 lb

## 2016-09-29 DIAGNOSIS — Z818 Family history of other mental and behavioral disorders: Secondary | ICD-10-CM

## 2016-09-29 DIAGNOSIS — F4310 Post-traumatic stress disorder, unspecified: Secondary | ICD-10-CM

## 2016-09-29 DIAGNOSIS — Z9889 Other specified postprocedural states: Secondary | ICD-10-CM | POA: Diagnosis not present

## 2016-09-29 DIAGNOSIS — Z79899 Other long term (current) drug therapy: Secondary | ICD-10-CM

## 2016-09-29 DIAGNOSIS — Z7982 Long term (current) use of aspirin: Secondary | ICD-10-CM

## 2016-09-29 DIAGNOSIS — Z87891 Personal history of nicotine dependence: Secondary | ICD-10-CM | POA: Diagnosis not present

## 2016-09-29 MED ORDER — ALPRAZOLAM 1 MG PO TABS: 1.0000 mg | ORAL_TABLET | Freq: Two times a day (BID) | ORAL | 2 refills | Status: DC | PRN

## 2016-09-29 MED ORDER — BUPROPION HCL ER (XL) 150 MG PO TB24: 150.0000 mg | ORAL_TABLET | ORAL | 2 refills | Status: DC
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
## 2016-10-19 ENCOUNTER — Telehealth (HOSPITAL_COMMUNITY): Payer: Self-pay | Admitting: *Deleted

## 2016-10-19 NOTE — Telephone Encounter (Signed)
voice message from patient regarding meds.   No details left in message.

## 2016-10-19 NOTE — Telephone Encounter (Signed)
returned phone call to patient regarding voice message.  No details left in message regarding meds.  Need to know what patient needs are regarding meds.

## 2016-10-20 ENCOUNTER — Telehealth (HOSPITAL_COMMUNITY): Payer: Self-pay | Admitting: *Deleted

## 2016-10-20 MED ORDER — ALPRAZOLAM 1 MG PO TABS: 1.0000 mg | ORAL_TABLET | Freq: Three times a day (TID) | ORAL | 0 refills | Status: DC | PRN

## 2016-10-20 NOTE — Telephone Encounter (Signed)
You may call in xanax 1 mg # 90, one tid, no refills. Cancel previous xanax with pharmacy
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
## 2016-10-20 NOTE — Telephone Encounter (Signed)
Spoke with pt. See note in epic 

## 2016-10-20 NOTE — Telephone Encounter (Signed)
Per provider to call in new script for pt Xanax 1 mg TID with 90 tabs 0 refill. [facility] informed [facility] with script information. Called pharmacy back and spoke with Kirt BoysMolly to inform Beth to cancel out pt previous Xanax script per provider due to new script and Kirt BoysMolly verbalized understanding. Called pt and informed her script was called into her pharmacy. Pt verbalized understanding.

## 2016-10-20 NOTE — Telephone Encounter (Signed)
Called in pt medication to [facility] and she verbalized understanding.

## 2016-10-21 ENCOUNTER — Ambulatory Visit (HOSPITAL_COMMUNITY): Payer: Self-pay | Admitting: Psychiatry

## 2016-10-27 ENCOUNTER — Encounter (HOSPITAL_COMMUNITY): Payer: Self-pay

## 2016-10-27 ENCOUNTER — Ambulatory Visit (HOSPITAL_COMMUNITY): Payer: Self-pay | Admitting: Psychiatry

## 2016-11-05 ENCOUNTER — Ambulatory Visit (HOSPITAL_COMMUNITY): Payer: Self-pay | Admitting: Psychiatry

## 2016-11-17 ENCOUNTER — Telehealth (HOSPITAL_COMMUNITY): Payer: Self-pay | Admitting: *Deleted

## 2016-11-17 NOTE — Telephone Encounter (Signed)
phone call from patient.  She said her pharmacy said Dr. Charlott Rakesoss's nurse called and told them not to fill the Alprazolam 2 times a day.   She need someone to call her regarding this.

## 2016-11-18 ENCOUNTER — Encounter (HOSPITAL_COMMUNITY): Payer: Self-pay | Admitting: Psychiatry

## 2016-11-18 ENCOUNTER — Ambulatory Visit (INDEPENDENT_AMBULATORY_CARE_PROVIDER_SITE_OTHER): Payer: BLUE CROSS/BLUE SHIELD | Admitting: Psychiatry

## 2016-11-18 VITALS — BP 122/85 | HR 73 | Ht 64.0 in | Wt 142.2 lb

## 2016-11-18 DIAGNOSIS — Z818 Family history of other mental and behavioral disorders: Secondary | ICD-10-CM | POA: Diagnosis not present

## 2016-11-18 DIAGNOSIS — Z87891 Personal history of nicotine dependence: Secondary | ICD-10-CM | POA: Diagnosis not present

## 2016-11-18 DIAGNOSIS — Z79899 Other long term (current) drug therapy: Secondary | ICD-10-CM | POA: Diagnosis not present

## 2016-11-18 DIAGNOSIS — F4310 Post-traumatic stress disorder, unspecified: Secondary | ICD-10-CM | POA: Diagnosis not present

## 2016-11-18 MED ORDER — BUPROPION HCL ER (XL) 150 MG PO TB24: 150.0000 mg | ORAL_TABLET | ORAL | 2 refills | Status: DC

## 2016-11-18 MED ORDER — ALPRAZOLAM 1 MG PO TABS: 1.0000 mg | ORAL_TABLET | Freq: Three times a day (TID) | ORAL | 2 refills | Status: DC | PRN

## 2016-11-18 MED ORDER — BUSPIRONE HCL 10 MG PO TABS: 10.0000 mg | ORAL_TABLET | Freq: Three times a day (TID) | ORAL | 2 refills | Status: DC

## 2016-11-18 NOTE — Telephone Encounter (Signed)
Pt came in today 11-18-2016 for f/u with provider
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
## 2016-11-18 NOTE — Telephone Encounter (Signed)
Spoke with pt and situation is fixed with her Xanax.

## 2016-11-20 ENCOUNTER — Ambulatory Visit (HOSPITAL_COMMUNITY): Payer: Self-pay | Admitting: Psychiatry

## 2016-12-07 ENCOUNTER — Ambulatory Visit (HOSPITAL_COMMUNITY): Payer: Self-pay | Admitting: Psychiatry

## 2016-12-30 ENCOUNTER — Ambulatory Visit (INDEPENDENT_AMBULATORY_CARE_PROVIDER_SITE_OTHER): Payer: BLUE CROSS/BLUE SHIELD | Admitting: Psychiatry

## 2016-12-30 ENCOUNTER — Encounter (HOSPITAL_COMMUNITY): Payer: Self-pay | Admitting: Psychiatry

## 2016-12-30 VITALS — BP 131/73 | HR 82 | Ht 64.0 in | Wt 145.6 lb

## 2016-12-30 DIAGNOSIS — Z87891 Personal history of nicotine dependence: Secondary | ICD-10-CM

## 2016-12-30 DIAGNOSIS — Z79899 Other long term (current) drug therapy: Secondary | ICD-10-CM

## 2016-12-30 DIAGNOSIS — Z818 Family history of other mental and behavioral disorders: Secondary | ICD-10-CM

## 2016-12-30 DIAGNOSIS — F4310 Post-traumatic stress disorder, unspecified: Secondary | ICD-10-CM

## 2016-12-30 MED ORDER — ALPRAZOLAM 1 MG PO TABS: 1.0000 mg | ORAL_TABLET | Freq: Three times a day (TID) | ORAL | 2 refills | Status: DC | PRN

## 2016-12-30 MED ORDER — BUPROPION HCL ER (XL) 300 MG PO TB24: 300.0000 mg | ORAL_TABLET | ORAL | 2 refills | Status: DC

## 2016-12-30 MED ORDER — TRAZODONE HCL 50 MG PO TABS
50.0000 mg | ORAL_TABLET | Freq: Every day | ORAL | 2 refills | Status: DC
Start: 2016-12-30 — End: 2017-02-10
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
## 2017-01-12 ENCOUNTER — Emergency Department (HOSPITAL_COMMUNITY)
Admission: EM | Admit: 2017-01-12 | Discharge: 2017-01-12 | Disposition: A | Discharge status: Home / Self Care | Payer: BLUE CROSS/BLUE SHIELD | Attending: Emergency Medicine | Admitting: Emergency Medicine

## 2017-01-12 ENCOUNTER — Emergency Department (HOSPITAL_COMMUNITY): Payer: BLUE CROSS/BLUE SHIELD

## 2017-01-12 ENCOUNTER — Encounter (HOSPITAL_COMMUNITY): Payer: Self-pay | Admitting: Emergency Medicine

## 2017-01-12 DIAGNOSIS — W109XXA Fall (on) (from) unspecified stairs and steps, initial encounter: Secondary | ICD-10-CM | POA: Diagnosis not present

## 2017-01-12 DIAGNOSIS — S43402A Unspecified sprain of left shoulder joint, initial encounter: Secondary | ICD-10-CM | POA: Insufficient documentation

## 2017-01-12 DIAGNOSIS — M545 Low back pain: Secondary | ICD-10-CM | POA: Diagnosis not present

## 2017-01-12 DIAGNOSIS — M546 Pain in thoracic spine: Secondary | ICD-10-CM | POA: Insufficient documentation

## 2017-01-12 DIAGNOSIS — R200 Anesthesia of skin: Secondary | ICD-10-CM | POA: Diagnosis not present

## 2017-01-12 DIAGNOSIS — Z87891 Personal history of nicotine dependence: Secondary | ICD-10-CM | POA: Diagnosis not present

## 2017-01-12 DIAGNOSIS — Y929 Unspecified place or not applicable: Secondary | ICD-10-CM | POA: Diagnosis not present

## 2017-01-12 DIAGNOSIS — Z79899 Other long term (current) drug therapy: Secondary | ICD-10-CM | POA: Diagnosis not present

## 2017-01-12 DIAGNOSIS — T07XXXA Unspecified multiple injuries, initial encounter: Secondary | ICD-10-CM

## 2017-01-12 DIAGNOSIS — Y999 Unspecified external cause status: Secondary | ICD-10-CM | POA: Diagnosis not present

## 2017-01-12 DIAGNOSIS — Z7982 Long term (current) use of aspirin: Secondary | ICD-10-CM | POA: Diagnosis not present

## 2017-01-12 DIAGNOSIS — S4992XA Unspecified injury of left shoulder and upper arm, initial encounter: Secondary | ICD-10-CM | POA: Diagnosis present

## 2017-01-12 DIAGNOSIS — Y939 Activity, unspecified: Secondary | ICD-10-CM | POA: Diagnosis not present

## 2017-01-12 DIAGNOSIS — W108XXA Fall (on) (from) other stairs and steps, initial encounter: Secondary | ICD-10-CM

## 2017-01-12 LAB — URINALYSIS, ROUTINE W REFLEX MICROSCOPIC
BACTERIA UA: NONE SEEN
BILIRUBIN URINE: NEGATIVE
Glucose, UA: NEGATIVE mg/dL
Ketones, ur: NEGATIVE mg/dL
LEUKOCYTES UA: NEGATIVE
Nitrite: NEGATIVE
PH: 6 (ref 5.0–8.0)
PROTEIN: NEGATIVE mg/dL
Specific Gravity, Urine: 1.004 — ABNORMAL LOW (ref 1.005–1.030)
WBC UA: NONE SEEN WBC/hpf (ref 0–5)

## 2017-01-12 MED ORDER — ONDANSETRON HCL 4 MG/2ML IJ SOLN
4.0000 mg | Freq: Once | INTRAMUSCULAR | Status: AC
  Administered 2017-01-12: 4 mg via INTRAVENOUS
  Filled 2017-01-12: qty 2

## 2017-01-12 MED ORDER — CYCLOBENZAPRINE HCL 10 MG PO TABS: 10.0000 mg | ORAL_TABLET | Freq: Three times a day (TID) | ORAL | 0 refills | Status: DC

## 2017-01-12 MED ORDER — IBUPROFEN 600 MG PO TABS: 600.0000 mg | ORAL_TABLET | Freq: Four times a day (QID) | ORAL | 0 refills | Status: DC

## 2017-01-12 MED ORDER — MORPHINE SULFATE (PF) 4 MG/ML IV SOLN
INTRAVENOUS | Status: AC
  Administered 2017-01-12: 4 mg via INTRAVENOUS
  Filled 2017-01-12: qty 1

## 2017-01-12 MED ORDER — HYDROCODONE-ACETAMINOPHEN 5-325 MG PO TABS: 1.0000 | ORAL_TABLET | ORAL | 0 refills | Status: DC | PRN

## 2017-01-12 MED ORDER — MORPHINE SULFATE (PF) 4 MG/ML IV SOLN
4.0000 mg | Freq: Once | INTRAVENOUS | Status: AC
Start: 2017-01-12 — End: 2017-01-12
  Administered 2017-01-12: 4 mg via INTRAVENOUS
  Filled 2017-01-12: qty 1

## 2017-01-12 MED ORDER — DIAZEPAM 5 MG PO TABS
5.0000 mg | ORAL_TABLET | Freq: Once | ORAL | Status: AC
  Administered 2017-01-12: 5 mg via ORAL
  Filled 2017-01-12: qty 1

## 2017-01-12 MED ORDER — MORPHINE SULFATE (PF) 4 MG/ML IV SOLN
4.0000 mg | Freq: Once | INTRAVENOUS | Status: AC
  Administered 2017-01-12: 4 mg via INTRAVENOUS

## 2017-01-12 NOTE — ED Notes (Signed)
Pt log rolled and placed on bedpan.

## 2017-01-12 NOTE — ED Triage Notes (Signed)
Per EMS patient fell down about 7 concrete steps. Pt states that she can not move her left arm. States decreased sensation in left arm.  EMS states swelling deformity around T7 and T6.  C-Collar in place.

## 2017-01-12 NOTE — ED Notes (Signed)
Patient transported to CT 
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
## 2017-01-12 NOTE — Discharge Instructions (Signed)
Your ct scans and xrays are negative for acute fracture or dislocation. Your have spasm at multiple sites. Your exam suggest shoulder sprain/strain. Please use the shoulder immobilizer for the next 4 days. Use $Rem of ibuprofen with breakfast, lunch, dinner and at bed time. Use flexeril three times daily for spasm. Use norco for more severe pain. Norco and flexeril may cause drowsiness. Please use these medications with caution. Please see Dr Romeo Apple for orthopedic evaluation if not improving.

## 2017-01-12 NOTE — ED Notes (Signed)
PA in with pt 

## 2017-01-14 LAB — URINE CULTURE
Culture: NO GROWTH
Special Requests: NORMAL

## 2017-01-16 ENCOUNTER — Other Ambulatory Visit (HOSPITAL_COMMUNITY): Payer: Self-pay | Admitting: Psychiatry

## 2017-02-10 ENCOUNTER — Ambulatory Visit (INDEPENDENT_AMBULATORY_CARE_PROVIDER_SITE_OTHER): Payer: BLUE CROSS/BLUE SHIELD | Admitting: Psychiatry

## 2017-02-10 ENCOUNTER — Encounter (HOSPITAL_COMMUNITY): Payer: Self-pay | Admitting: Psychiatry

## 2017-02-10 VITALS — BP 106/69 | HR 78 | Ht 64.0 in | Wt 143.2 lb

## 2017-02-10 DIAGNOSIS — Z818 Family history of other mental and behavioral disorders: Secondary | ICD-10-CM

## 2017-02-10 DIAGNOSIS — F4310 Post-traumatic stress disorder, unspecified: Secondary | ICD-10-CM | POA: Diagnosis not present

## 2017-02-10 DIAGNOSIS — Z87891 Personal history of nicotine dependence: Secondary | ICD-10-CM

## 2017-02-10 MED ORDER — ALPRAZOLAM 1 MG PO TABS: 1.0000 mg | ORAL_TABLET | Freq: Three times a day (TID) | ORAL | 2 refills | Status: DC | PRN

## 2017-02-10 MED ORDER — ZOLPIDEM TARTRATE 10 MG PO TABS: 10.0000 mg | ORAL_TABLET | Freq: Every evening | ORAL | 2 refills | Status: DC | PRN

## 2017-02-10 MED ORDER — BUPROPION HCL ER (XL) 300 MG PO TB24: 300.0000 mg | ORAL_TABLET | ORAL | 2 refills | Status: DC
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
## 2017-03-09 ENCOUNTER — Telehealth (HOSPITAL_COMMUNITY): Payer: Self-pay | Admitting: *Deleted

## 2017-03-09 NOTE — Telephone Encounter (Signed)
phone call from patient she has concerns regarding her Ambien.   Would like a phone call please.

## 2017-03-09 NOTE — Telephone Encounter (Signed)
Spoke with pt and she stated that her Ambien is not working. Per pt, she is prescribed Xanax TID. Per pt, she's been taking more of her Xanax then prescribed because she's been having trouble sleeping since her dad past away. Per pt, she still have more refills of her Xanax at the pharmacy. Per pt, she just wanted Dr. Tenny Crawoss to give her permission to the pharmacy for them to fill her Xanax early. Per pt, her next fill date is July 2nd. Informed pt that provider is out of the office for the week and will not be back until 03-15-2017. Informed pt that due to her stating that her Ambien is not working, a message will also be sent to the doctor that's helping Dr. Tenny Crawoss for this week. Informed pt that the provider that is helping Dr. Tenny Crawoss while she is out of the office does not prescribe Xanax. Pt verbalized understanding and stated okay.

## 2017-03-09 NOTE — Telephone Encounter (Signed)
I do not feel comfortable for earlier refill given the amount she is already taking (TID). Will forward this message to Dr. Tenny Crawoss.

## 2017-03-15 NOTE — Telephone Encounter (Signed)
No early refills on controlled meds 

## 2017-03-18 NOTE — Telephone Encounter (Signed)
Spoke with pt and she stated that it was the pharmacy fault. Per pt she changed her pharmacy to [facility].

## 2017-03-24 ENCOUNTER — Ambulatory Visit (HOSPITAL_COMMUNITY): Payer: Self-pay | Admitting: Psychiatry

## 2017-04-05 ENCOUNTER — Ambulatory Visit (INDEPENDENT_AMBULATORY_CARE_PROVIDER_SITE_OTHER): Payer: BLUE CROSS/BLUE SHIELD | Admitting: Psychiatry

## 2017-04-05 ENCOUNTER — Encounter (HOSPITAL_COMMUNITY): Payer: Self-pay | Admitting: Psychiatry

## 2017-04-05 VITALS — BP 127/82 | HR 65 | Ht 64.0 in | Wt 143.8 lb

## 2017-04-05 DIAGNOSIS — G4700 Insomnia, unspecified: Secondary | ICD-10-CM | POA: Diagnosis not present

## 2017-04-05 DIAGNOSIS — F4310 Post-traumatic stress disorder, unspecified: Secondary | ICD-10-CM

## 2017-04-05 DIAGNOSIS — Z818 Family history of other mental and behavioral disorders: Secondary | ICD-10-CM

## 2017-04-05 DIAGNOSIS — Z87891 Personal history of nicotine dependence: Secondary | ICD-10-CM | POA: Diagnosis not present

## 2017-04-05 MED ORDER — ALPRAZOLAM 1 MG PO TABS: 1.0000 mg | ORAL_TABLET | Freq: Three times a day (TID) | ORAL | 2 refills | Status: DC | PRN

## 2017-04-05 MED ORDER — BUPROPION HCL ER (XL) 300 MG PO TB24: 300.0000 mg | ORAL_TABLET | ORAL | 2 refills | Status: DC

## 2017-04-05 MED ORDER — TEMAZEPAM 15 MG PO CAPS: 15.0000 mg | ORAL_CAPSULE | Freq: Every evening | ORAL | 2 refills | Status: DC | PRN
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
## 2017-04-08 ENCOUNTER — Telehealth (HOSPITAL_COMMUNITY): Payer: Self-pay | Admitting: *Deleted

## 2017-04-08 NOTE — Telephone Encounter (Signed)
Per verbal from Dr. Tenny Crawoss during pt previous appt to sch therapy appt for pt with Valerie Rose. Request we provided to Valerie Rose who stated staff should sch pt. Staff called pt on 04-08-2017 and pre-recorded message stated this number is disconnected. Staff unable to sch therapy appt with pt at this time and will sch therapy appt for pt when pt calls office.

## 2017-04-27 ENCOUNTER — Other Ambulatory Visit (HOSPITAL_COMMUNITY): Payer: Self-pay | Admitting: Psychiatry

## 2017-05-24 ENCOUNTER — Encounter (HOSPITAL_COMMUNITY): Payer: Self-pay | Admitting: *Deleted

## 2017-05-24 DIAGNOSIS — R112 Nausea with vomiting, unspecified: Secondary | ICD-10-CM | POA: Diagnosis not present

## 2017-05-24 DIAGNOSIS — R1013 Epigastric pain: Secondary | ICD-10-CM | POA: Diagnosis not present

## 2017-05-24 DIAGNOSIS — Z79899 Other long term (current) drug therapy: Secondary | ICD-10-CM | POA: Insufficient documentation

## 2017-05-24 DIAGNOSIS — Z7982 Long term (current) use of aspirin: Secondary | ICD-10-CM | POA: Diagnosis not present

## 2017-05-24 LAB — URINALYSIS, ROUTINE W REFLEX MICROSCOPIC
BILIRUBIN URINE: NEGATIVE
GLUCOSE, UA: NEGATIVE mg/dL
HGB URINE DIPSTICK: NEGATIVE
KETONES UR: NEGATIVE mg/dL
Leukocytes, UA: NEGATIVE
Nitrite: NEGATIVE
PROTEIN: NEGATIVE mg/dL
Specific Gravity, Urine: 1.001 — ABNORMAL LOW (ref 1.005–1.030)
pH: 7 (ref 5.0–8.0)

## 2017-05-24 LAB — CBC
HCT: 38.9 % (ref 36.0–46.0)
Hemoglobin: 13.3 g/dL (ref 12.0–15.0)
MCH: 30.6 pg (ref 26.0–34.0)
MCHC: 34.2 g/dL (ref 30.0–36.0)
MCV: 89.6 fL (ref 78.0–100.0)
PLATELETS: 245 10*3/uL (ref 150–400)
RBC: 4.34 MIL/uL (ref 3.87–5.11)
RDW: 12 % (ref 11.5–15.5)
WBC: 9.1 10*3/uL (ref 4.0–10.5)

## 2017-05-24 LAB — POC URINE PREG, ED: PREG TEST UR: NEGATIVE

## 2017-05-24 MED ORDER — ONDANSETRON 4 MG PO TBDP
ORAL_TABLET | ORAL | Status: AC
  Filled 2017-05-24: qty 1

## 2017-05-24 MED ORDER — OXYCODONE-ACETAMINOPHEN 5-325 MG PO TABS
ORAL_TABLET | ORAL | Status: AC
  Filled 2017-05-24: qty 1

## 2017-05-24 MED ORDER — OXYCODONE-ACETAMINOPHEN 5-325 MG PO TABS
1.0000 | ORAL_TABLET | ORAL | Status: DC | PRN
  Administered 2017-05-24: 1 via ORAL

## 2017-05-24 MED ORDER — ONDANSETRON 4 MG PO TBDP
4.0000 mg | ORAL_TABLET | Freq: Once | ORAL | Status: AC | PRN
  Administered 2017-05-24: 4 mg via ORAL

## 2017-05-24 NOTE — ED Triage Notes (Signed)
Pt c/o upper abdominal pain for the past several hours with [facility].

## 2017-05-25 ENCOUNTER — Emergency Department (HOSPITAL_COMMUNITY): Payer: BLUE CROSS/BLUE SHIELD

## 2017-05-25 ENCOUNTER — Emergency Department (HOSPITAL_COMMUNITY)
Admission: EM | Admit: 2017-05-25 | Discharge: 2017-05-25 | Disposition: A | Discharge status: Home / Self Care | Payer: BLUE CROSS/BLUE SHIELD | Attending: Emergency Medicine | Admitting: Emergency Medicine

## 2017-05-25 DIAGNOSIS — R1013 Epigastric pain: Secondary | ICD-10-CM

## 2017-05-25 LAB — COMPREHENSIVE METABOLIC PANEL
ALT: 14 U/L (ref 14–54)
AST: 17 U/L (ref 15–41)
Albumin: 4.5 g/dL (ref 3.5–5.0)
Alkaline Phosphatase: 43 U/L (ref 38–126)
Anion gap: 6 (ref 5–15)
BUN: 5 mg/dL — AB (ref 6–20)
CALCIUM: 10.1 mg/dL (ref 8.9–10.3)
CO2: 23 mmol/L (ref 22–32)
CREATININE: 0.79 mg/dL (ref 0.44–1.00)
Chloride: 109 mmol/L (ref 101–111)
GFR calc Af Amer: >60 mL/min (ref >60)
Glucose, Bld: 92 mg/dL (ref 65–99)
POTASSIUM: 4.1 mmol/L (ref 3.5–5.1)
Sodium: 138 mmol/L (ref 135–145)
Total Bilirubin: 0.6 mg/dL (ref 0.3–1.2)
Total Protein: 6.7 g/dL (ref 6.5–8.1)

## 2017-05-25 LAB — LIPASE, BLOOD: Lipase: 27 U/L (ref 11–51)

## 2017-05-25 MED ORDER — ONDANSETRON HCL 4 MG/2ML IJ SOLN
4.0000 mg | Freq: Once | INTRAMUSCULAR | Status: AC
  Administered 2017-05-25: 4 mg via INTRAVENOUS
  Filled 2017-05-25: qty 2

## 2017-05-25 MED ORDER — GI COCKTAIL ~~LOC~~
30.0000 mL | Freq: Once | ORAL | Status: AC
  Administered 2017-05-25: 30 mL via ORAL
  Filled 2017-05-25: qty 30

## 2017-05-25 MED ORDER — OMEPRAZOLE 20 MG PO CPDR: 20.0000 mg | DELAYED_RELEASE_CAPSULE | Freq: Every day | ORAL | 0 refills | Status: DC

## 2017-05-25 MED ORDER — OXYCODONE-ACETAMINOPHEN 5-325 MG PO TABS
1.0000 | ORAL_TABLET | Freq: Once | ORAL | Status: AC
  Administered 2017-05-25: 1 via ORAL
  Filled 2017-05-25: qty 1

## 2017-05-25 MED ORDER — ONDANSETRON 4 MG PO TBDP: 4.0000 mg | ORAL_TABLET | Freq: Three times a day (TID) | ORAL | 0 refills | Status: DC | PRN

## 2017-05-25 NOTE — Discharge Instructions (Signed)
You were seen today for upper abdominal pain. Your workup is reassuring including right upper quadrant ultrasound. This may be related to gastritis, reflux, or ulcers. Take the acid reducer daily. Follow-up with your primary physician if not improving.
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
## 2017-06-03 ENCOUNTER — Ambulatory Visit (HOSPITAL_COMMUNITY): Payer: BLUE CROSS/BLUE SHIELD | Admitting: Psychiatry

## 2017-06-22 ENCOUNTER — Other Ambulatory Visit (HOSPITAL_COMMUNITY): Payer: Self-pay | Admitting: Psychiatry

## 2017-06-23 ENCOUNTER — Ambulatory Visit (HOSPITAL_COMMUNITY): Payer: Self-pay | Admitting: Psychiatry

## 2017-07-05 ENCOUNTER — Encounter (HOSPITAL_COMMUNITY): Payer: Self-pay | Admitting: Psychiatry

## 2017-07-05 ENCOUNTER — Ambulatory Visit (INDEPENDENT_AMBULATORY_CARE_PROVIDER_SITE_OTHER): Payer: BLUE CROSS/BLUE SHIELD | Admitting: Psychiatry

## 2017-07-05 VITALS — BP 123/68 | HR 60 | Ht 64.0 in | Wt 144.0 lb

## 2017-07-05 DIAGNOSIS — G4700 Insomnia, unspecified: Secondary | ICD-10-CM

## 2017-07-05 DIAGNOSIS — R450 Nervousness: Secondary | ICD-10-CM

## 2017-07-05 DIAGNOSIS — R51 Headache: Secondary | ICD-10-CM | POA: Diagnosis not present

## 2017-07-05 DIAGNOSIS — F515 Nightmare disorder: Secondary | ICD-10-CM

## 2017-07-05 DIAGNOSIS — R4584 Anhedonia: Secondary | ICD-10-CM

## 2017-07-05 DIAGNOSIS — Z62810 Personal history of physical and sexual abuse in childhood: Secondary | ICD-10-CM | POA: Diagnosis not present

## 2017-07-05 DIAGNOSIS — Z91410 Personal history of adult physical and sexual abuse: Secondary | ICD-10-CM

## 2017-07-05 DIAGNOSIS — Z87891 Personal history of nicotine dependence: Secondary | ICD-10-CM

## 2017-07-05 DIAGNOSIS — Z975 Presence of (intrauterine) contraceptive device: Secondary | ICD-10-CM | POA: Diagnosis not present

## 2017-07-05 DIAGNOSIS — F419 Anxiety disorder, unspecified: Secondary | ICD-10-CM

## 2017-07-05 DIAGNOSIS — F4310 Post-traumatic stress disorder, unspecified: Secondary | ICD-10-CM

## 2017-07-05 DIAGNOSIS — R4582 Worries: Secondary | ICD-10-CM | POA: Diagnosis not present

## 2017-07-05 DIAGNOSIS — R5383 Other fatigue: Secondary | ICD-10-CM

## 2017-07-05 DIAGNOSIS — F332 Major depressive disorder, recurrent severe without psychotic features: Secondary | ICD-10-CM | POA: Diagnosis not present

## 2017-07-05 MED ORDER — BUPROPION HCL ER (XL) 300 MG PO TB24: 300.0000 mg | ORAL_TABLET | ORAL | 2 refills | Status: DC

## 2017-07-05 MED ORDER — ALPRAZOLAM 1 MG PO TABS: 1.0000 mg | ORAL_TABLET | Freq: Three times a day (TID) | ORAL | 2 refills | Status: DC | PRN

## 2017-07-05 MED ORDER — PRAZOSIN HCL 2 MG PO CAPS: 2.0000 mg | ORAL_CAPSULE | Freq: Every day | ORAL | 2 refills | Status: DC
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
## 2017-07-15 ENCOUNTER — Ambulatory Visit (HOSPITAL_COMMUNITY): Payer: Self-pay | Admitting: Psychiatry

## 2017-07-27 ENCOUNTER — Telehealth (HOSPITAL_COMMUNITY): Payer: Self-pay | Admitting: *Deleted

## 2017-07-27 NOTE — Telephone Encounter (Signed)
Medication management - Telephone message left for patient requesting she call back to set up a time for a random UDS per instruction of Dr. Tenny Crawoss.

## 2017-07-27 NOTE — Telephone Encounter (Signed)
Patient called in  & is on schedule for 11/14 Wednesday @ 10:30 am

## 2017-07-28 ENCOUNTER — Encounter (HOSPITAL_COMMUNITY): Payer: Self-pay | Admitting: Psychiatry

## 2017-07-28 ENCOUNTER — Encounter: Payer: Self-pay | Admitting: Psychiatry

## 2017-07-28 ENCOUNTER — Ambulatory Visit (HOSPITAL_COMMUNITY): Payer: BLUE CROSS/BLUE SHIELD | Admitting: Psychiatry

## 2017-07-28 ENCOUNTER — Telehealth (HOSPITAL_COMMUNITY): Payer: Self-pay | Admitting: *Deleted

## 2017-07-28 ENCOUNTER — Encounter (HOSPITAL_COMMUNITY): Payer: Self-pay

## 2017-07-28 NOTE — Telephone Encounter (Signed)
Patient  Came in for UDS  & stated that her life is a mess  & she's unable to sleep. And that she's been a extra xanax along with the prazosin which has been helping her to fall asleep & rest.  Per Dr Tenny Crawoss try taking 2 xanax during the day & the other 1 @ night with the prazosin to keep from running out.

## 2017-07-29 NOTE — Progress Notes (Signed)
Pt here only for urine tox screen

## 2017-08-18 ENCOUNTER — Encounter (HOSPITAL_COMMUNITY): Payer: Self-pay | Admitting: Psychiatry

## 2017-08-18 ENCOUNTER — Ambulatory Visit (INDEPENDENT_AMBULATORY_CARE_PROVIDER_SITE_OTHER): Payer: BLUE CROSS/BLUE SHIELD | Admitting: Psychiatry

## 2017-08-18 VITALS — BP 122/84 | HR 80 | Ht 64.0 in | Wt 144.0 lb

## 2017-08-18 DIAGNOSIS — Z811 Family history of alcohol abuse and dependence: Secondary | ICD-10-CM | POA: Diagnosis not present

## 2017-08-18 DIAGNOSIS — Z975 Presence of (intrauterine) contraceptive device: Secondary | ICD-10-CM | POA: Diagnosis not present

## 2017-08-18 DIAGNOSIS — Z818 Family history of other mental and behavioral disorders: Secondary | ICD-10-CM | POA: Diagnosis not present

## 2017-08-18 DIAGNOSIS — F419 Anxiety disorder, unspecified: Secondary | ICD-10-CM | POA: Diagnosis not present

## 2017-08-18 DIAGNOSIS — Z87891 Personal history of nicotine dependence: Secondary | ICD-10-CM | POA: Diagnosis not present

## 2017-08-18 DIAGNOSIS — R450 Nervousness: Secondary | ICD-10-CM

## 2017-08-18 DIAGNOSIS — F4310 Post-traumatic stress disorder, unspecified: Secondary | ICD-10-CM

## 2017-08-18 MED ORDER — BUPROPION HCL ER (XL) 300 MG PO TB24: 300.0000 mg | ORAL_TABLET | ORAL | 2 refills | Status: DC

## 2017-08-18 MED ORDER — PRAZOSIN HCL 2 MG PO CAPS: 2.0000 mg | ORAL_CAPSULE | Freq: Every day | ORAL | 2 refills | Status: DC

## 2017-08-18 MED ORDER — ALPRAZOLAM 1 MG PO TABS: 1.0000 mg | ORAL_TABLET | Freq: Three times a day (TID) | ORAL | 2 refills | Status: DC | PRN
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
## 2017-08-31 ENCOUNTER — Ambulatory Visit (HOSPITAL_COMMUNITY): Payer: Self-pay | Admitting: Psychiatry

## 2017-09-04 ENCOUNTER — Other Ambulatory Visit: Payer: Self-pay

## 2017-09-04 ENCOUNTER — Emergency Department (HOSPITAL_COMMUNITY)
Admission: EM | Admit: 2017-09-04 | Discharge: 2017-09-05 | Disposition: A | Discharge status: Home / Self Care | Payer: BLUE CROSS/BLUE SHIELD | Attending: Emergency Medicine | Admitting: Emergency Medicine

## 2017-09-04 ENCOUNTER — Encounter (HOSPITAL_COMMUNITY): Payer: Self-pay | Admitting: *Deleted

## 2017-09-04 DIAGNOSIS — Z87891 Personal history of nicotine dependence: Secondary | ICD-10-CM | POA: Diagnosis not present

## 2017-09-04 DIAGNOSIS — N939 Abnormal uterine and vaginal bleeding, unspecified: Secondary | ICD-10-CM

## 2017-09-04 DIAGNOSIS — N946 Dysmenorrhea, unspecified: Secondary | ICD-10-CM | POA: Diagnosis not present

## 2017-09-04 DIAGNOSIS — Z79899 Other long term (current) drug therapy: Secondary | ICD-10-CM | POA: Insufficient documentation

## 2017-09-04 MED ORDER — MORPHINE SULFATE (PF) 4 MG/ML IV SOLN
4.0000 mg | Freq: Once | INTRAVENOUS | Status: AC
  Administered 2017-09-04: 4 mg via INTRAVENOUS
  Filled 2017-09-04: qty 1

## 2017-09-04 MED ORDER — ONDANSETRON HCL 4 MG/2ML IJ SOLN
4.0000 mg | Freq: Once | INTRAMUSCULAR | Status: AC
  Administered 2017-09-04: 4 mg via INTRAVENOUS
  Filled 2017-09-04: qty 2

## 2017-09-04 MED ORDER — SODIUM CHLORIDE 0.9 % IV BOLUS (SEPSIS)
1000.0000 mL | Freq: Once | INTRAVENOUS | Status: AC
  Administered 2017-09-04: 1000 mL via INTRAVENOUS

## 2017-09-04 NOTE — ED Triage Notes (Signed)
Pt states was seen here a month ago for bleeding, Started again yesterday.
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
## 2017-09-05 LAB — CBC WITH DIFFERENTIAL/PLATELET
Basophils Absolute: 0 10*3/uL (ref 0.0–0.1)
Basophils Relative: 0 %
Eosinophils Absolute: 0.3 10*3/uL (ref 0.0–0.7)
Eosinophils Relative: 4 %
HEMATOCRIT: 37.9 % (ref 36.0–46.0)
Hemoglobin: 12.6 g/dL (ref 12.0–15.0)
LYMPHS PCT: 42 %
Lymphs Abs: 3.3 10*3/uL (ref 0.7–4.0)
MCH: 30.4 pg (ref 26.0–34.0)
MCHC: 33.2 g/dL (ref 30.0–36.0)
MCV: 91.3 fL (ref 78.0–100.0)
MONO ABS: 0.6 10*3/uL (ref 0.1–1.0)
MONOS PCT: 8 %
NEUTROS ABS: 3.6 10*3/uL (ref 1.7–7.7)
Neutrophils Relative %: 46 %
Platelets: 238 10*3/uL (ref 150–400)
RBC: 4.15 MIL/uL (ref 3.87–5.11)
RDW: 13 % (ref 11.5–15.5)
WBC: 7.8 10*3/uL (ref 4.0–10.5)

## 2017-09-05 LAB — URINALYSIS, ROUTINE W REFLEX MICROSCOPIC
BILIRUBIN URINE: NEGATIVE
Bacteria, UA: NONE SEEN
Glucose, UA: NEGATIVE mg/dL
Ketones, ur: NEGATIVE mg/dL
LEUKOCYTES UA: NEGATIVE
Nitrite: NEGATIVE
PH: 5 (ref 5.0–8.0)
Protein, ur: 30 mg/dL — AB
SPECIFIC GRAVITY, URINE: 1.02 (ref 1.005–1.030)

## 2017-09-05 LAB — BASIC METABOLIC PANEL
ANION GAP: 12 (ref 5–15)
BUN: 8 mg/dL (ref 6–20)
CALCIUM: 9.4 mg/dL (ref 8.9–10.3)
CO2: 21 mmol/L — AB (ref 22–32)
CREATININE: 0.58 mg/dL (ref 0.44–1.00)
Chloride: 105 mmol/L (ref 101–111)
GFR calc Af Amer: >60 mL/min (ref >60)
GFR calc non Af Amer: >60 mL/min (ref >60)
GLUCOSE: 108 mg/dL — AB (ref 65–99)
Potassium: 3.3 mmol/L — ABNORMAL LOW (ref 3.5–5.1)
Sodium: 138 mmol/L (ref 135–145)

## 2017-09-05 LAB — WET PREP, GENITAL
SPERM: NONE SEEN
Trich, Wet Prep: NONE SEEN
Yeast Wet Prep HPF POC: NONE SEEN

## 2017-09-05 LAB — I-STAT BETA HCG BLOOD, ED (MC, WL, AP ONLY)

## 2017-09-05 MED ORDER — TRAMADOL HCL 50 MG PO TABS: 50.0000 mg | ORAL_TABLET | Freq: Four times a day (QID) | ORAL | 0 refills | Status: DC | PRN

## 2017-09-05 MED ORDER — IBUPROFEN 800 MG PO TABS: 800.0000 mg | ORAL_TABLET | Freq: Four times a day (QID) | ORAL | 0 refills | Status: DC | PRN

## 2017-09-05 MED ORDER — KETOROLAC TROMETHAMINE 30 MG/ML IJ SOLN
30.0000 mg | Freq: Once | INTRAMUSCULAR | Status: AC
  Administered 2017-09-05: 30 mg via INTRAVENOUS
  Filled 2017-09-05: qty 1

## 2017-09-06 LAB — GC/CHLAMYDIA PROBE AMP (~~LOC~~) NOT AT ARMC
Chlamydia: NEGATIVE
NEISSERIA GONORRHEA: NEGATIVE

## 2017-09-06 LAB — HIV ANTIBODY (ROUTINE TESTING W REFLEX): HIV SCREEN 4TH GENERATION: NONREACTIVE

## 2017-10-19 ENCOUNTER — Ambulatory Visit (HOSPITAL_COMMUNITY): Payer: Self-pay | Admitting: Psychiatry

## 2017-10-22 ENCOUNTER — Encounter (HOSPITAL_COMMUNITY): Payer: Self-pay | Admitting: Psychiatry

## 2017-10-22 ENCOUNTER — Ambulatory Visit (INDEPENDENT_AMBULATORY_CARE_PROVIDER_SITE_OTHER): Payer: BLUE CROSS/BLUE SHIELD | Admitting: Psychiatry

## 2017-10-22 VITALS — BP 114/73 | HR 61 | Ht 64.0 in | Wt 148.0 lb

## 2017-10-22 DIAGNOSIS — Z811 Family history of alcohol abuse and dependence: Secondary | ICD-10-CM

## 2017-10-22 DIAGNOSIS — Z975 Presence of (intrauterine) contraceptive device: Secondary | ICD-10-CM

## 2017-10-22 DIAGNOSIS — Z818 Family history of other mental and behavioral disorders: Secondary | ICD-10-CM | POA: Diagnosis not present

## 2017-10-22 DIAGNOSIS — R450 Nervousness: Secondary | ICD-10-CM | POA: Diagnosis not present

## 2017-10-22 DIAGNOSIS — Z62810 Personal history of physical and sexual abuse in childhood: Secondary | ICD-10-CM

## 2017-10-22 DIAGNOSIS — F419 Anxiety disorder, unspecified: Secondary | ICD-10-CM

## 2017-10-22 DIAGNOSIS — F4310 Post-traumatic stress disorder, unspecified: Secondary | ICD-10-CM

## 2017-10-22 DIAGNOSIS — Z87891 Personal history of nicotine dependence: Secondary | ICD-10-CM

## 2017-10-22 MED ORDER — BUPROPION HCL ER (XL) 300 MG PO TB24: 300.0000 mg | ORAL_TABLET | ORAL | 2 refills | Status: DC

## 2017-10-22 MED ORDER — ALPRAZOLAM 1 MG PO TABS: 1.0000 mg | ORAL_TABLET | Freq: Three times a day (TID) | ORAL | 2 refills | Status: DC | PRN

## 2017-10-22 MED ORDER — PAROXETINE HCL 20 MG PO TABS: 20.0000 mg | ORAL_TABLET | Freq: Every day | ORAL | 2 refills | Status: DC

## 2017-10-22 MED ORDER — PRAZOSIN HCL 2 MG PO CAPS: 2.0000 mg | ORAL_CAPSULE | Freq: Every day | ORAL | 2 refills | Status: DC
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
## 2017-10-30 ENCOUNTER — Encounter (HOSPITAL_COMMUNITY): Payer: Self-pay | Admitting: Emergency Medicine

## 2017-10-30 ENCOUNTER — Other Ambulatory Visit: Payer: Self-pay

## 2017-10-30 ENCOUNTER — Emergency Department (HOSPITAL_COMMUNITY)
Admission: EM | Admit: 2017-10-30 | Discharge: 2017-10-30 | Disposition: A | Discharge status: Home / Self Care | Payer: BLUE CROSS/BLUE SHIELD | Attending: Emergency Medicine | Admitting: Emergency Medicine

## 2017-10-30 DIAGNOSIS — J111 Influenza due to unidentified influenza virus with other respiratory manifestations: Secondary | ICD-10-CM | POA: Insufficient documentation

## 2017-10-30 DIAGNOSIS — Z87891 Personal history of nicotine dependence: Secondary | ICD-10-CM | POA: Diagnosis not present

## 2017-10-30 DIAGNOSIS — R112 Nausea with vomiting, unspecified: Secondary | ICD-10-CM | POA: Diagnosis present

## 2017-10-30 DIAGNOSIS — R69 Illness, unspecified: Secondary | ICD-10-CM

## 2017-10-30 LAB — URINALYSIS, ROUTINE W REFLEX MICROSCOPIC
BACTERIA UA: NONE SEEN
Bilirubin Urine: NEGATIVE
Glucose, UA: NEGATIVE mg/dL
Hgb urine dipstick: NEGATIVE
KETONES UR: 20 mg/dL — AB
Leukocytes, UA: NEGATIVE
Nitrite: NEGATIVE
Protein, ur: 30 mg/dL — AB
Specific Gravity, Urine: 1.031 — ABNORMAL HIGH (ref 1.005–1.030)
pH: 5 (ref 5.0–8.0)

## 2017-10-30 LAB — PREGNANCY, URINE: PREG TEST UR: NEGATIVE

## 2017-10-30 MED ORDER — SODIUM CHLORIDE 0.9 % IV BOLUS (SEPSIS)
1000.0000 mL | Freq: Once | INTRAVENOUS | Status: AC
  Administered 2017-10-30: 1000 mL via INTRAVENOUS

## 2017-10-30 MED ORDER — ONDANSETRON 4 MG PO TBDP: 4.0000 mg | ORAL_TABLET | Freq: Three times a day (TID) | ORAL | 0 refills | Status: AC | PRN

## 2017-10-30 MED ORDER — OSELTAMIVIR PHOSPHATE 75 MG PO CAPS: 75.0000 mg | ORAL_CAPSULE | Freq: Two times a day (BID) | ORAL | 0 refills | Status: DC

## 2017-10-30 MED ORDER — KETOROLAC TROMETHAMINE 30 MG/ML IJ SOLN
30.0000 mg | Freq: Once | INTRAMUSCULAR | Status: AC
  Administered 2017-10-30: 30 mg via INTRAVENOUS
  Filled 2017-10-30: qty 1

## 2017-10-30 MED ORDER — ONDANSETRON HCL 4 MG/2ML IJ SOLN
4.0000 mg | Freq: Once | INTRAMUSCULAR | Status: AC
  Administered 2017-10-30: 4 mg via INTRAVENOUS
  Filled 2017-10-30: qty 2

## 2017-10-30 NOTE — ED Triage Notes (Signed)
Pt reports flu-like sx X1 day, son dx with flu. Has had multiple episodes of emesis. No improvement with OTC medication. Tylenol appox 1 hour PTA.

## 2017-10-30 NOTE — ED Notes (Signed)
Pt reports feeling some better  

## 2017-10-30 NOTE — ED Notes (Signed)
Pt awaiting re assessment and disposition

## 2017-10-30 NOTE — ED Notes (Signed)
Pt alert & oriented x4, stable gait. Patient given discharge instructions, paperwork & prescription(s). Patient  instructed to stop at the registration desk to finish any additional paperwork. Patient verbalized understanding. Pt left department w/ no further questions. 

## 2017-10-30 NOTE — ED Notes (Signed)
Pt recently here with child who was Dx'd w flu.  Noone has had flu shot as pt reports, "We never get it"  Vomited x4 today with last at 1400  IV established- blood drawn
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
## 2017-10-30 NOTE — ED Notes (Signed)
States felling better & drink provided for pt to sip on.

## 2017-11-07 ENCOUNTER — Other Ambulatory Visit (HOSPITAL_COMMUNITY): Payer: Self-pay | Admitting: Psychiatry

## 2017-12-03 ENCOUNTER — Ambulatory Visit (HOSPITAL_COMMUNITY): Payer: BLUE CROSS/BLUE SHIELD | Admitting: Psychiatry

## 2017-12-09 ENCOUNTER — Encounter (HOSPITAL_COMMUNITY): Payer: Self-pay | Admitting: Psychiatry

## 2017-12-09 ENCOUNTER — Ambulatory Visit (INDEPENDENT_AMBULATORY_CARE_PROVIDER_SITE_OTHER): Payer: BLUE CROSS/BLUE SHIELD | Admitting: Psychiatry

## 2017-12-09 VITALS — BP 123/75 | HR 72 | Ht 64.0 in | Wt 151.0 lb

## 2017-12-09 DIAGNOSIS — F339 Major depressive disorder, recurrent, unspecified: Secondary | ICD-10-CM

## 2017-12-09 DIAGNOSIS — Z91410 Personal history of adult physical and sexual abuse: Secondary | ICD-10-CM | POA: Diagnosis not present

## 2017-12-09 DIAGNOSIS — F419 Anxiety disorder, unspecified: Secondary | ICD-10-CM | POA: Diagnosis not present

## 2017-12-09 DIAGNOSIS — F410 Panic disorder [episodic paroxysmal anxiety] without agoraphobia: Secondary | ICD-10-CM | POA: Diagnosis not present

## 2017-12-09 DIAGNOSIS — Z630 Problems in relationship with spouse or partner: Secondary | ICD-10-CM

## 2017-12-09 DIAGNOSIS — Z62810 Personal history of physical and sexual abuse in childhood: Secondary | ICD-10-CM

## 2017-12-09 DIAGNOSIS — Z87828 Personal history of other (healed) physical injury and trauma: Secondary | ICD-10-CM | POA: Diagnosis not present

## 2017-12-09 DIAGNOSIS — R450 Nervousness: Secondary | ICD-10-CM

## 2017-12-09 DIAGNOSIS — Z975 Presence of (intrauterine) contraceptive device: Secondary | ICD-10-CM | POA: Diagnosis not present

## 2017-12-09 DIAGNOSIS — F515 Nightmare disorder: Secondary | ICD-10-CM

## 2017-12-09 DIAGNOSIS — Z818 Family history of other mental and behavioral disorders: Secondary | ICD-10-CM

## 2017-12-09 DIAGNOSIS — Z87891 Personal history of nicotine dependence: Secondary | ICD-10-CM | POA: Diagnosis not present

## 2017-12-09 DIAGNOSIS — Z811 Family history of alcohol abuse and dependence: Secondary | ICD-10-CM

## 2017-12-09 DIAGNOSIS — F4310 Post-traumatic stress disorder, unspecified: Secondary | ICD-10-CM

## 2017-12-09 MED ORDER — ALPRAZOLAM 1 MG PO TABS: 1.0000 mg | ORAL_TABLET | Freq: Four times a day (QID) | ORAL | 2 refills | Status: DC

## 2017-12-09 MED ORDER — BUPROPION HCL ER (XL) 300 MG PO TB24: 300.0000 mg | ORAL_TABLET | ORAL | 2 refills | Status: DC

## 2017-12-09 MED ORDER — PRAZOSIN HCL 2 MG PO CAPS: 2.0000 mg | ORAL_CAPSULE | Freq: Every day | ORAL | 2 refills | Status: DC
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
## 2017-12-16 ENCOUNTER — Encounter (HOSPITAL_COMMUNITY): Payer: Self-pay

## 2017-12-16 ENCOUNTER — Emergency Department (HOSPITAL_COMMUNITY)
Admission: EM | Admit: 2017-12-16 | Discharge: 2017-12-16 | Disposition: A | Discharge status: Home / Self Care | Payer: BLUE CROSS/BLUE SHIELD | Attending: Emergency Medicine | Admitting: Emergency Medicine

## 2017-12-16 ENCOUNTER — Other Ambulatory Visit: Payer: Self-pay

## 2017-12-16 DIAGNOSIS — R1084 Generalized abdominal pain: Secondary | ICD-10-CM | POA: Insufficient documentation

## 2017-12-16 DIAGNOSIS — N938 Other specified abnormal uterine and vaginal bleeding: Secondary | ICD-10-CM | POA: Insufficient documentation

## 2017-12-16 DIAGNOSIS — N939 Abnormal uterine and vaginal bleeding, unspecified: Secondary | ICD-10-CM

## 2017-12-16 DIAGNOSIS — Z3202 Encounter for pregnancy test, result negative: Secondary | ICD-10-CM | POA: Insufficient documentation

## 2017-12-16 DIAGNOSIS — Z79899 Other long term (current) drug therapy: Secondary | ICD-10-CM | POA: Insufficient documentation

## 2017-12-16 DIAGNOSIS — Z87891 Personal history of nicotine dependence: Secondary | ICD-10-CM | POA: Insufficient documentation

## 2017-12-16 LAB — URINALYSIS, ROUTINE W REFLEX MICROSCOPIC
Bilirubin Urine: NEGATIVE
Glucose, UA: NEGATIVE mg/dL
KETONES UR: NEGATIVE mg/dL
Leukocytes, UA: NEGATIVE
Nitrite: NEGATIVE
PH: 7 (ref 5.0–8.0)
PROTEIN: NEGATIVE mg/dL
Specific Gravity, Urine: 1.005 (ref 1.005–1.030)

## 2017-12-16 LAB — BASIC METABOLIC PANEL WITH GFR
Anion gap: 10 (ref 5–15)
BUN: 11 mg/dL (ref 6–20)
CO2: 24 mmol/L (ref 22–32)
Calcium: 10.3 mg/dL (ref 8.9–10.3)
Chloride: 106 mmol/L (ref 101–111)
Creatinine, Ser: 0.77 mg/dL (ref 0.44–1.00)
GFR calc Af Amer: >60 mL/min
GFR calc non Af Amer: >60 mL/min
Glucose, Bld: 72 mg/dL (ref 65–99)
Potassium: 3.8 mmol/L (ref 3.5–5.1)
Sodium: 140 mmol/L (ref 135–145)

## 2017-12-16 LAB — HCG, QUANTITATIVE, PREGNANCY: hCG, Beta Chain, Quant, S: <1 m[IU]/mL (ref <5)

## 2017-12-16 LAB — CBC
HCT: 42.5 % (ref 36.0–46.0)
Hemoglobin: 14.1 g/dL (ref 12.0–15.0)
MCH: 30.3 pg (ref 26.0–34.0)
MCHC: 33.2 g/dL (ref 30.0–36.0)
MCV: 91.2 fL (ref 78.0–100.0)
PLATELETS: 274 10*3/uL (ref 150–400)
RBC: 4.66 MIL/uL (ref 3.87–5.11)
RDW: 12.5 % (ref 11.5–15.5)
WBC: 7.7 10*3/uL (ref 4.0–10.5)
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
## 2017-12-16 NOTE — Discharge Instructions (Addendum)
Follow-up with your OB/GYN as needed for continued vaginal bleeding and cramping. Return to the emergency room with any new or concerning symptoms.

## 2017-12-16 NOTE — ED Triage Notes (Signed)
Pt started spotting this morning that was very dark brown. Pt had a negative urine test, but did have blood work drawn today and [facility]'t [facility] [place]. Wants to know if she's pregnant today bc she takes medications that she needs to stop immediately if she is pregnant. Pt has had abdominal cramps for 2 weeks

## 2018-02-08 ENCOUNTER — Ambulatory Visit (HOSPITAL_COMMUNITY): Payer: Self-pay | Admitting: Psychiatry

## 2018-02-18 ENCOUNTER — Encounter (HOSPITAL_COMMUNITY): Payer: Self-pay | Admitting: Psychiatry

## 2018-02-18 ENCOUNTER — Ambulatory Visit (INDEPENDENT_AMBULATORY_CARE_PROVIDER_SITE_OTHER): Payer: Self-pay | Admitting: Psychiatry

## 2018-02-18 VITALS — BP 134/87 | HR 68 | Ht 64.0 in | Wt 154.0 lb

## 2018-02-18 DIAGNOSIS — Z881 Allergy status to other antibiotic agents status: Secondary | ICD-10-CM

## 2018-02-18 DIAGNOSIS — F339 Major depressive disorder, recurrent, unspecified: Secondary | ICD-10-CM

## 2018-02-18 DIAGNOSIS — Z62810 Personal history of physical and sexual abuse in childhood: Secondary | ICD-10-CM

## 2018-02-18 DIAGNOSIS — Z79899 Other long term (current) drug therapy: Secondary | ICD-10-CM

## 2018-02-18 DIAGNOSIS — F4310 Post-traumatic stress disorder, unspecified: Secondary | ICD-10-CM

## 2018-02-18 DIAGNOSIS — F410 Panic disorder [episodic paroxysmal anxiety] without agoraphobia: Secondary | ICD-10-CM

## 2018-02-18 DIAGNOSIS — F515 Nightmare disorder: Secondary | ICD-10-CM

## 2018-02-18 DIAGNOSIS — Z818 Family history of other mental and behavioral disorders: Secondary | ICD-10-CM

## 2018-02-18 DIAGNOSIS — F419 Anxiety disorder, unspecified: Secondary | ICD-10-CM

## 2018-02-18 DIAGNOSIS — Z91410 Personal history of adult physical and sexual abuse: Secondary | ICD-10-CM

## 2018-02-18 DIAGNOSIS — Z87891 Personal history of nicotine dependence: Secondary | ICD-10-CM

## 2018-02-18 DIAGNOSIS — Z975 Presence of (intrauterine) contraceptive device: Secondary | ICD-10-CM

## 2018-02-18 DIAGNOSIS — Z87828 Personal history of other (healed) physical injury and trauma: Secondary | ICD-10-CM

## 2018-02-18 MED ORDER — PRAZOSIN HCL 2 MG PO CAPS: 2.0000 mg | ORAL_CAPSULE | Freq: Every day | ORAL | 2 refills | Status: DC

## 2018-02-18 MED ORDER — BUPROPION HCL ER (XL) 300 MG PO TB24: 300.0000 mg | ORAL_TABLET | ORAL | 2 refills | Status: DC

## 2018-02-18 MED ORDER — ALPRAZOLAM 1 MG PO TABS: 1.0000 mg | ORAL_TABLET | Freq: Four times a day (QID) | ORAL | 2 refills | Status: DC
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
## 2018-05-14 ENCOUNTER — Other Ambulatory Visit (HOSPITAL_COMMUNITY): Payer: Self-pay | Admitting: Psychiatry

## 2018-05-17 ENCOUNTER — Ambulatory Visit (HOSPITAL_COMMUNITY): Payer: Self-pay | Admitting: Psychiatry

## 2018-05-18 ENCOUNTER — Encounter (HOSPITAL_COMMUNITY): Payer: Self-pay | Admitting: Psychiatry

## 2018-05-18 ENCOUNTER — Ambulatory Visit (INDEPENDENT_AMBULATORY_CARE_PROVIDER_SITE_OTHER): Payer: Self-pay | Admitting: Psychiatry

## 2018-05-18 VITALS — BP 120/79 | HR 70 | Ht 64.0 in | Wt 158.0 lb

## 2018-05-18 DIAGNOSIS — Z87891 Personal history of nicotine dependence: Secondary | ICD-10-CM

## 2018-05-18 DIAGNOSIS — Z811 Family history of alcohol abuse and dependence: Secondary | ICD-10-CM

## 2018-05-18 DIAGNOSIS — F4310 Post-traumatic stress disorder, unspecified: Secondary | ICD-10-CM

## 2018-05-18 DIAGNOSIS — Z818 Family history of other mental and behavioral disorders: Secondary | ICD-10-CM

## 2018-05-18 MED ORDER — BUPROPION HCL ER (XL) 300 MG PO TB24: 300.0000 mg | ORAL_TABLET | ORAL | 2 refills | Status: DC

## 2018-05-18 MED ORDER — ALPRAZOLAM 1 MG PO TABS: 1.0000 mg | ORAL_TABLET | Freq: Four times a day (QID) | ORAL | 2 refills | Status: DC

## 2018-05-18 MED ORDER — PRAZOSIN HCL 2 MG PO CAPS: 2.0000 mg | ORAL_CAPSULE | Freq: Every day | ORAL | 2 refills | Status: DC
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
## 2018-07-31 ENCOUNTER — Other Ambulatory Visit (HOSPITAL_COMMUNITY): Payer: Self-pay | Admitting: Psychiatry

## 2018-08-08 ENCOUNTER — Other Ambulatory Visit (HOSPITAL_COMMUNITY): Payer: Self-pay | Admitting: Psychiatry

## 2018-08-08 ENCOUNTER — Telehealth (HOSPITAL_COMMUNITY): Payer: Self-pay | Admitting: *Deleted

## 2018-08-08 NOTE — Telephone Encounter (Signed)
Already sent.

## 2018-08-08 NOTE — Telephone Encounter (Signed)
Dr Tenny Crawoss Patient called requesting refill on Xanax. Next appointment is 08/18/18

## 2018-08-17 ENCOUNTER — Ambulatory Visit (HOSPITAL_COMMUNITY): Payer: Self-pay | Admitting: Psychiatry

## 2018-08-30 ENCOUNTER — Encounter (HOSPITAL_COMMUNITY): Payer: Self-pay | Admitting: Psychiatry

## 2018-08-30 ENCOUNTER — Ambulatory Visit (INDEPENDENT_AMBULATORY_CARE_PROVIDER_SITE_OTHER): Payer: Self-pay | Admitting: Psychiatry

## 2018-08-30 VITALS — BP 110/75 | HR 79 | Ht 64.0 in | Wt 158.0 lb

## 2018-08-30 DIAGNOSIS — F4310 Post-traumatic stress disorder, unspecified: Secondary | ICD-10-CM

## 2018-08-30 MED ORDER — PRAZOSIN HCL 5 MG PO CAPS: 5.0000 mg | ORAL_CAPSULE | Freq: Every day | ORAL | 2 refills | Status: DC

## 2018-08-30 MED ORDER — ALPRAZOLAM 1 MG PO TABS: 1.0000 mg | ORAL_TABLET | Freq: Four times a day (QID) | ORAL | 2 refills | Status: DC

## 2018-08-30 MED ORDER — BUPROPION HCL ER (XL) 300 MG PO TB24: 300.0000 mg | ORAL_TABLET | ORAL | 2 refills | Status: DC
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
## 2018-09-22 ENCOUNTER — Other Ambulatory Visit: Payer: Self-pay

## 2018-09-22 ENCOUNTER — Emergency Department (HOSPITAL_COMMUNITY)
Admission: EM | Admit: 2018-09-22 | Discharge: 2018-09-22 | Disposition: A | Discharge status: Home / Self Care | Payer: Self-pay | Attending: Emergency Medicine | Admitting: Emergency Medicine

## 2018-09-22 ENCOUNTER — Emergency Department (HOSPITAL_COMMUNITY): Payer: Self-pay

## 2018-09-22 ENCOUNTER — Encounter (HOSPITAL_COMMUNITY): Payer: Self-pay | Admitting: Emergency Medicine

## 2018-09-22 DIAGNOSIS — Z87891 Personal history of nicotine dependence: Secondary | ICD-10-CM | POA: Insufficient documentation

## 2018-09-22 DIAGNOSIS — Z79899 Other long term (current) drug therapy: Secondary | ICD-10-CM | POA: Insufficient documentation

## 2018-09-22 DIAGNOSIS — R1031 Right lower quadrant pain: Secondary | ICD-10-CM | POA: Insufficient documentation

## 2018-09-22 LAB — COMPREHENSIVE METABOLIC PANEL
ALBUMIN: 4.6 g/dL (ref 3.5–5.0)
ALK PHOS: 48 U/L (ref 38–126)
ALT: 15 U/L (ref 0–44)
AST: 15 U/L (ref 15–41)
Anion gap: 9 (ref 5–15)
BILIRUBIN TOTAL: 0.7 mg/dL (ref 0.3–1.2)
BUN: 8 mg/dL (ref 6–20)
CALCIUM: 9.5 mg/dL (ref 8.9–10.3)
CO2: 24 mmol/L (ref 22–32)
Chloride: 108 mmol/L (ref 98–111)
Creatinine, Ser: 0.6 mg/dL (ref 0.44–1.00)
GFR calc Af Amer: >60 mL/min (ref >60)
GFR calc non Af Amer: >60 mL/min (ref >60)
GLUCOSE: 98 mg/dL (ref 70–99)
Potassium: 3.5 mmol/L (ref 3.5–5.1)
Sodium: 141 mmol/L (ref 135–145)
TOTAL PROTEIN: 7.5 g/dL (ref 6.5–8.1)

## 2018-09-22 LAB — URINALYSIS, ROUTINE W REFLEX MICROSCOPIC
BILIRUBIN URINE: NEGATIVE
GLUCOSE, UA: NEGATIVE mg/dL
Hgb urine dipstick: NEGATIVE
Ketones, ur: NEGATIVE mg/dL
Leukocytes, UA: NEGATIVE
Nitrite: NEGATIVE
PH: 7 (ref 5.0–8.0)
Protein, ur: NEGATIVE mg/dL
SPECIFIC GRAVITY, URINE: 1.006 (ref 1.005–1.030)

## 2018-09-22 LAB — CBC WITH DIFFERENTIAL/PLATELET
Abs Immature Granulocytes: 0.01 10*3/uL (ref 0.00–0.07)
Basophils Absolute: 0 10*3/uL (ref 0.0–0.1)
Basophils Relative: 0 %
EOS PCT: 4 %
Eosinophils Absolute: 0.4 10*3/uL (ref 0.0–0.5)
HEMATOCRIT: 40.8 % (ref 36.0–46.0)
HEMOGLOBIN: 13.6 g/dL (ref 12.0–15.0)
IMMATURE GRANULOCYTES: 0 %
LYMPHS ABS: 3 10*3/uL (ref 0.7–4.0)
LYMPHS PCT: 37 %
MCH: 30.6 pg (ref 26.0–34.0)
MCHC: 33.3 g/dL (ref 30.0–36.0)
MCV: 91.9 fL (ref 80.0–100.0)
MONOS PCT: 8 %
Monocytes Absolute: 0.7 10*3/uL (ref 0.1–1.0)
NRBC: 0 % (ref 0.0–0.2)
Neutro Abs: 4.2 10*3/uL (ref 1.7–7.7)
Neutrophils Relative %: 51 %
Platelets: 287 10*3/uL (ref 150–400)
RBC: 4.44 MIL/uL (ref 3.87–5.11)
RDW: 11.5 % (ref 11.5–15.5)
WBC: 8.2 10*3/uL (ref 4.0–10.5)

## 2018-09-22 LAB — PREGNANCY, URINE: Preg Test, Ur: NEGATIVE

## 2018-09-22 LAB — LIPASE, BLOOD: Lipase: 28 U/L (ref 11–51)

## 2018-09-22 MED ORDER — FENTANYL CITRATE (PF) 100 MCG/2ML IJ SOLN
100.0000 ug | Freq: Once | INTRAMUSCULAR | Status: AC
  Administered 2018-09-22: 100 ug via INTRAVENOUS
  Filled 2018-09-22: qty 2

## 2018-09-22 MED ORDER — SODIUM CHLORIDE 0.9 % IV SOLN
INTRAVENOUS | Status: DC
  Administered 2018-09-22: 13:00:00 via INTRAVENOUS

## 2018-09-22 MED ORDER — IOPAMIDOL (ISOVUE-300) INJECTION 61%
100.0000 mL | Freq: Once | INTRAVENOUS | Status: AC | PRN
  Administered 2018-09-22: 100 mL via INTRAVENOUS

## 2018-09-22 MED ORDER — ONDANSETRON HCL 4 MG/2ML IJ SOLN
4.0000 mg | Freq: Once | INTRAMUSCULAR | Status: AC
  Administered 2018-09-22: 4 mg via INTRAVENOUS
  Filled 2018-09-22: qty 2

## 2018-09-22 MED ORDER — IBUPROFEN 600 MG PO TABS: 600.0000 mg | ORAL_TABLET | Freq: Four times a day (QID) | ORAL | 0 refills | Status: AC | PRN

## 2018-09-22 NOTE — ED Triage Notes (Signed)
Pt states that she is having pain in her back right flank and abd pain that started last night.
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
## 2018-09-22 NOTE — Discharge Instructions (Signed)
Your testing today shows no signs of any significant abnormalities causing pain.  You do have a kidney stone on the left but it is still inside the kidney and not causing any pain at all.  It is impossible to predict when this may come out and cause pain.  There are no findings on the right side to cause any pain around her side to her back or the abdomen.  Please take ibuprofen 3 times daily, follow-up with your doctor in 24 hours for a recheck or return to the emergency department for severe or worsening symptoms.

## 2018-11-19 ENCOUNTER — Other Ambulatory Visit (HOSPITAL_COMMUNITY): Payer: Self-pay | Admitting: Psychiatry

## 2018-11-28 ENCOUNTER — Telehealth (HOSPITAL_COMMUNITY): Payer: Self-pay

## 2018-11-28 NOTE — Telephone Encounter (Signed)
Medication management - Message left for patient after she left one requesting a call.  Requested patietn call our office back if she needed assistance with anything.

## 2018-11-29 ENCOUNTER — Ambulatory Visit (HOSPITAL_COMMUNITY): Payer: Self-pay | Admitting: Psychiatry

## 2018-12-02 ENCOUNTER — Other Ambulatory Visit: Payer: Self-pay

## 2018-12-02 ENCOUNTER — Ambulatory Visit (INDEPENDENT_AMBULATORY_CARE_PROVIDER_SITE_OTHER): Payer: Self-pay | Admitting: Psychiatry

## 2018-12-02 ENCOUNTER — Encounter (HOSPITAL_COMMUNITY): Payer: Self-pay | Admitting: Psychiatry

## 2018-12-02 VITALS — BP 115/75 | HR 80 | Ht 64.0 in | Wt 160.0 lb

## 2018-12-02 DIAGNOSIS — F4310 Post-traumatic stress disorder, unspecified: Secondary | ICD-10-CM

## 2018-12-02 MED ORDER — ALPRAZOLAM 1 MG PO TABS: 1.0000 mg | ORAL_TABLET | Freq: Four times a day (QID) | ORAL | 0 refills | Status: DC

## 2018-12-02 MED ORDER — PRAZOSIN HCL 5 MG PO CAPS: 5.0000 mg | ORAL_CAPSULE | Freq: Every day | ORAL | 2 refills | Status: DC

## 2018-12-02 MED ORDER — BUPROPION HCL ER (XL) 300 MG PO TB24: 300.0000 mg | ORAL_TABLET | ORAL | 2 refills | Status: DC
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
## 2018-12-21 IMAGING — DX DG HUMERUS 2V *L*
3 series · 3 of 3 positions shown · non-contrast
Comparison: None.

CLINICAL DATA: Pain following fall

EXAM:
LEFT HUMERUS - 2+ VIEW

[humerus ap]
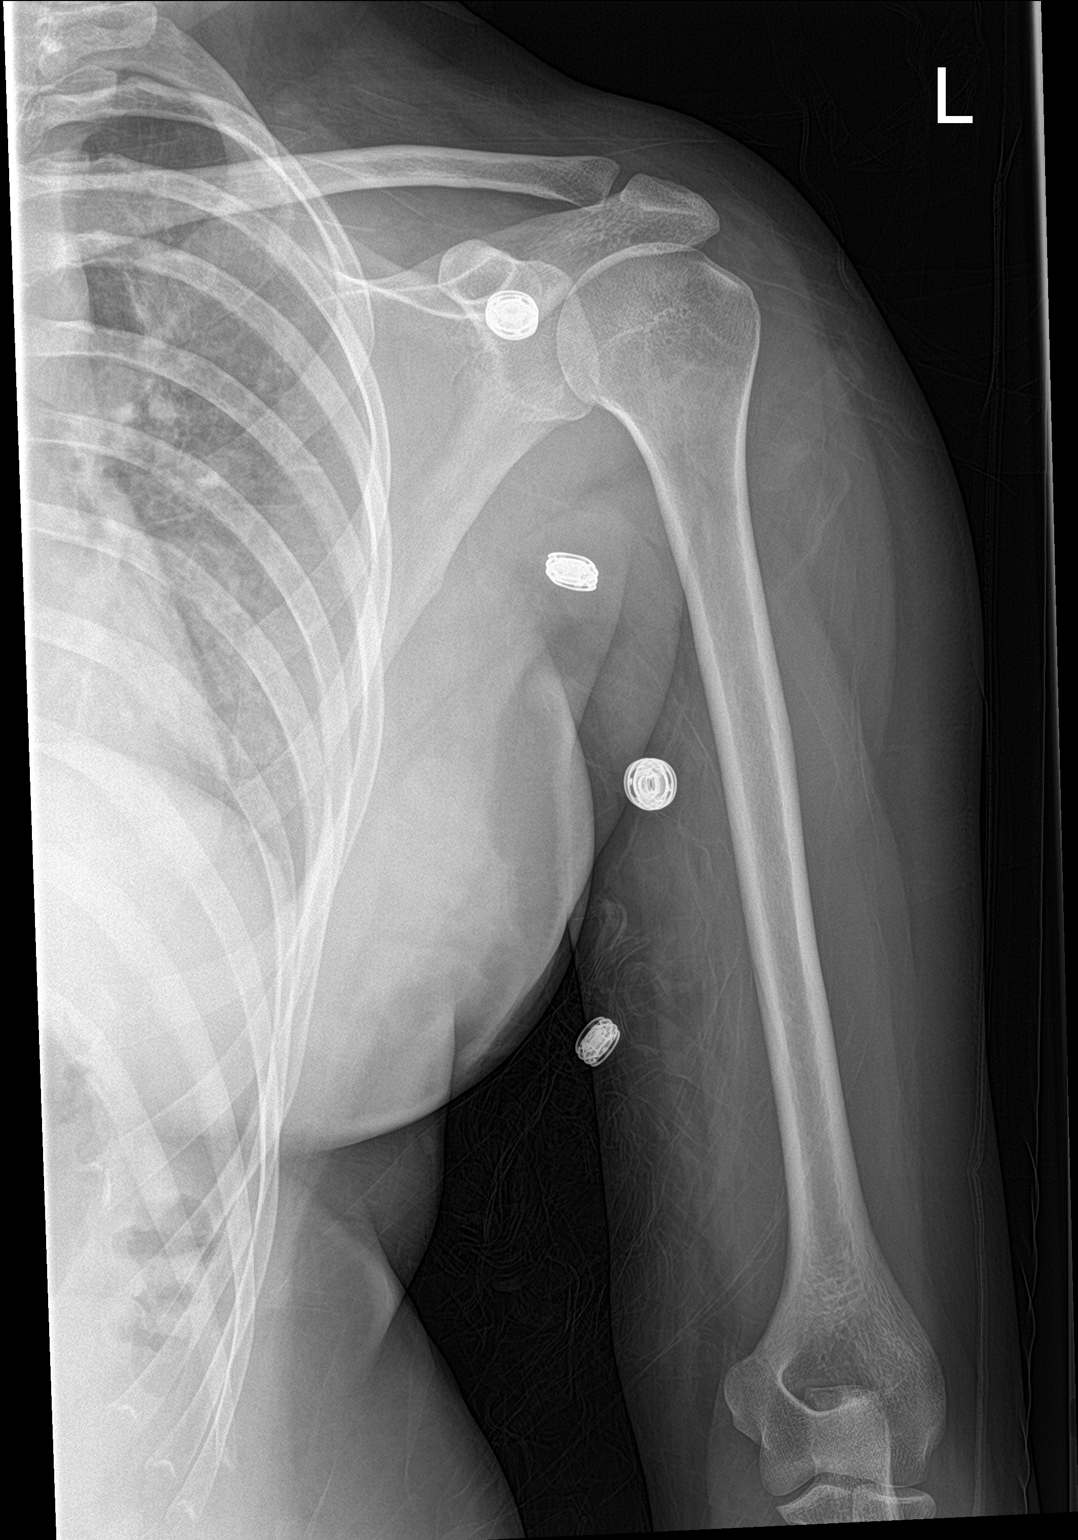

[humerus lat (1 of 2)]
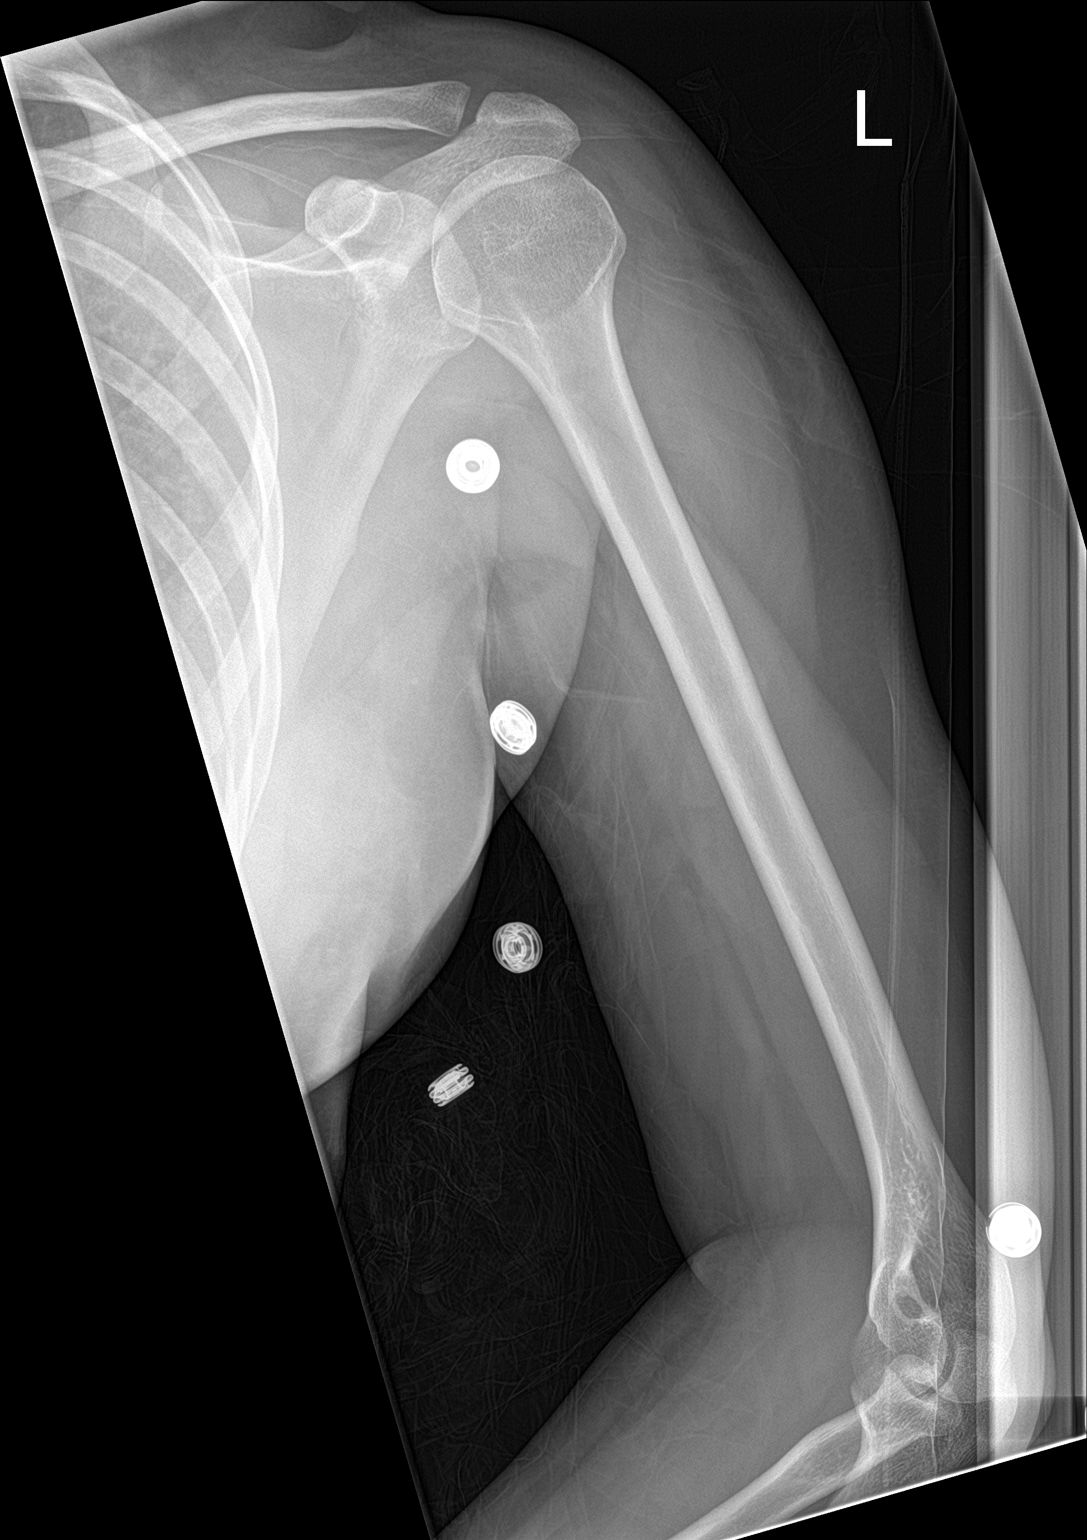

[humerus lat (2 of 2)]
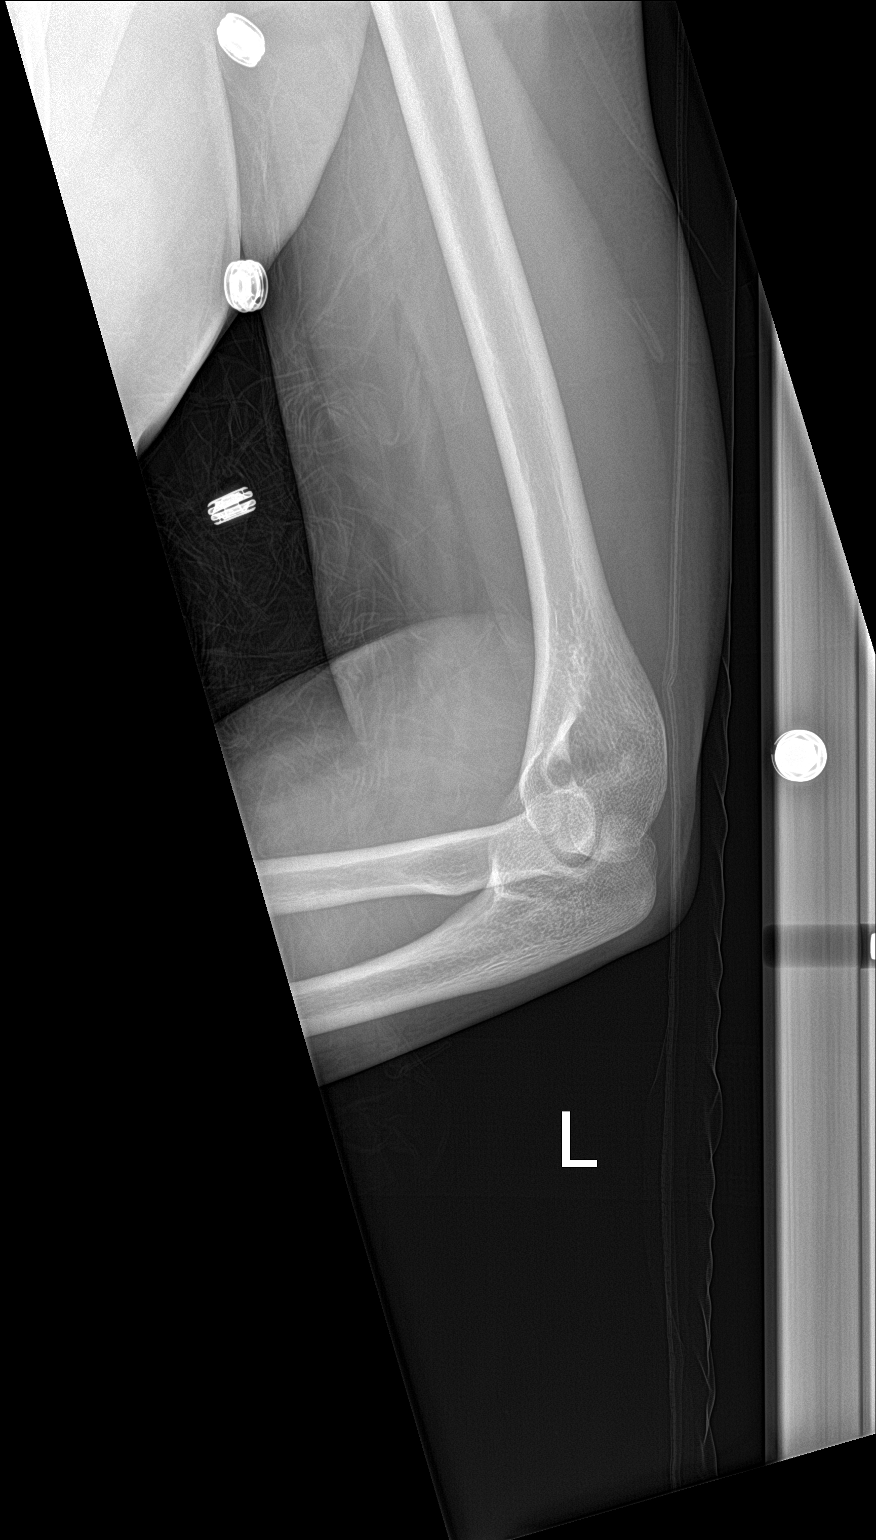

[3 of 3 positions shown; findings below may reference images not displayed]

FINDINGS: Frontal and lateral views were obtained. No fracture or dislocation.
Joint spaces appear intact. No erosive change. Visualized left lung
clear.
IMPRESSION: No fracture or dislocation.  No evident arthropathy.

## 2018-12-26 ENCOUNTER — Telehealth (HOSPITAL_COMMUNITY): Payer: Self-pay | Admitting: *Deleted

## 2018-12-26 NOTE — Telephone Encounter (Signed)
Dr Tenny Craw Patient called concerned stating she normally has  3 refills & this time her Rx  For ALPRAZolam Prudy Feeler) 1 MG tablet 120 tablet 0 refills  Requested additional refills  Last visit  12-02-2018 & Next Visit 03-02-2019

## 2018-12-27 ENCOUNTER — Other Ambulatory Visit (HOSPITAL_COMMUNITY): Payer: Self-pay | Admitting: Psychiatry

## 2018-12-27 MED ORDER — ALPRAZOLAM 1 MG PO TABS: 1.0000 mg | ORAL_TABLET | Freq: Four times a day (QID) | ORAL | 2 refills | Status: DC

## 2018-12-27 NOTE — Telephone Encounter (Signed)
corrected

## 2019-03-02 ENCOUNTER — Ambulatory Visit (HOSPITAL_COMMUNITY): Payer: Self-pay | Admitting: Psychiatry

## 2019-03-08 ENCOUNTER — Other Ambulatory Visit: Payer: Self-pay

## 2019-03-08 ENCOUNTER — Ambulatory Visit (HOSPITAL_COMMUNITY): Payer: Self-pay | Admitting: Psychiatry

## 2019-03-14 ENCOUNTER — Other Ambulatory Visit: Payer: Self-pay

## 2019-03-14 ENCOUNTER — Encounter (HOSPITAL_COMMUNITY): Payer: Self-pay | Admitting: Psychiatry

## 2019-03-14 ENCOUNTER — Ambulatory Visit (INDEPENDENT_AMBULATORY_CARE_PROVIDER_SITE_OTHER): Payer: Self-pay | Admitting: Psychiatry

## 2019-03-14 DIAGNOSIS — Z20822 Contact with and (suspected) exposure to covid-19: Secondary | ICD-10-CM

## 2019-03-14 DIAGNOSIS — F4310 Post-traumatic stress disorder, unspecified: Secondary | ICD-10-CM

## 2019-03-14 MED ORDER — BUPROPION HCL ER (XL) 300 MG PO TB24: 300.0000 mg | ORAL_TABLET | ORAL | 2 refills | Status: DC

## 2019-03-14 MED ORDER — ESZOPICLONE 3 MG PO TABS: 3.0000 mg | ORAL_TABLET | Freq: Every evening | ORAL | 2 refills | Status: DC | PRN

## 2019-03-14 MED ORDER — PRAZOSIN HCL 5 MG PO CAPS: 5.0000 mg | ORAL_CAPSULE | Freq: Every day | ORAL | 2 refills | Status: DC

## 2019-03-14 MED ORDER — ALPRAZOLAM 1 MG PO TABS: 1.0000 mg | ORAL_TABLET | Freq: Four times a day (QID) | ORAL | 2 refills | Status: DC
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
## 2019-03-21 LAB — NOVEL CORONAVIRUS, NAA: SARS-CoV-2, NAA: NOT DETECTED

## 2019-06-12 ENCOUNTER — Ambulatory Visit (HOSPITAL_COMMUNITY): Payer: Self-pay | Admitting: Psychiatry

## 2019-06-12 ENCOUNTER — Other Ambulatory Visit: Payer: Self-pay

## 2019-06-21 ENCOUNTER — Ambulatory Visit (INDEPENDENT_AMBULATORY_CARE_PROVIDER_SITE_OTHER): Payer: Self-pay | Admitting: Psychiatry

## 2019-06-21 ENCOUNTER — Encounter (HOSPITAL_COMMUNITY): Payer: Self-pay | Admitting: Psychiatry

## 2019-06-21 ENCOUNTER — Other Ambulatory Visit: Payer: Self-pay

## 2019-06-21 DIAGNOSIS — F4310 Post-traumatic stress disorder, unspecified: Secondary | ICD-10-CM

## 2019-06-21 MED ORDER — BUPROPION HCL ER (XL) 300 MG PO TB24: 300.0000 mg | ORAL_TABLET | ORAL | 2 refills | Status: DC

## 2019-06-21 MED ORDER — ALPRAZOLAM 1 MG PO TABS: 1.0000 mg | ORAL_TABLET | Freq: Four times a day (QID) | ORAL | 2 refills | Status: DC
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
## 2019-07-05 ENCOUNTER — Other Ambulatory Visit: Payer: Self-pay

## 2019-07-05 DIAGNOSIS — Z20822 Contact with and (suspected) exposure to covid-19: Secondary | ICD-10-CM

## 2019-07-06 LAB — NOVEL CORONAVIRUS, NAA: SARS-CoV-2, NAA: NOT DETECTED

## 2019-07-18 ENCOUNTER — Other Ambulatory Visit: Payer: Self-pay | Admitting: *Deleted

## 2019-07-18 DIAGNOSIS — Z20822 Contact with and (suspected) exposure to covid-19: Secondary | ICD-10-CM

## 2019-07-19 LAB — NOVEL CORONAVIRUS, NAA: SARS-CoV-2, NAA: DETECTED — AB

## 2019-08-18 ENCOUNTER — Ambulatory Visit (HOSPITAL_COMMUNITY): Payer: Self-pay | Admitting: Psychiatry

## 2019-08-18 ENCOUNTER — Telehealth (HOSPITAL_COMMUNITY): Payer: Self-pay | Admitting: Psychiatry

## 2019-08-18 ENCOUNTER — Other Ambulatory Visit: Payer: Self-pay

## 2019-08-18 DIAGNOSIS — Z91199 Patient's noncompliance with other medical treatment and regimen due to unspecified reason: Secondary | ICD-10-CM

## 2019-09-11 ENCOUNTER — Encounter (HOSPITAL_COMMUNITY): Payer: Self-pay | Admitting: Psychiatry

## 2019-09-11 ENCOUNTER — Other Ambulatory Visit: Payer: Self-pay

## 2019-09-11 ENCOUNTER — Ambulatory Visit (INDEPENDENT_AMBULATORY_CARE_PROVIDER_SITE_OTHER): Payer: Self-pay | Admitting: Psychiatry

## 2019-09-11 DIAGNOSIS — F4310 Post-traumatic stress disorder, unspecified: Secondary | ICD-10-CM

## 2019-09-11 MED ORDER — ZOLPIDEM TARTRATE 10 MG PO TABS: 10.0000 mg | ORAL_TABLET | Freq: Every evening | ORAL | 2 refills | Status: DC | PRN

## 2019-09-11 MED ORDER — ALPRAZOLAM 1 MG PO TABS: 1.0000 mg | ORAL_TABLET | Freq: Four times a day (QID) | ORAL | 2 refills | Status: DC

## 2019-09-11 MED ORDER — BUPROPION HCL ER (XL) 300 MG PO TB24: 300.0000 mg | ORAL_TABLET | ORAL | 2 refills | Status: DC
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
## 2019-10-17 ENCOUNTER — Other Ambulatory Visit (HOSPITAL_COMMUNITY): Payer: Self-pay | Admitting: Psychiatry

## 2019-10-17 ENCOUNTER — Telehealth (HOSPITAL_COMMUNITY): Payer: Self-pay | Admitting: *Deleted

## 2019-10-17 MED ORDER — ZOLPIDEM TARTRATE ER 12.5 MG PO TBCR: 12.5000 mg | EXTENDED_RELEASE_TABLET | Freq: Every evening | ORAL | 2 refills | Status: DC | PRN

## 2019-10-17 NOTE — Telephone Encounter (Signed)
Changed to [employer] cr

## 2019-10-17 NOTE — Telephone Encounter (Signed)
Patient called & stated that  zolpidem (AMBIEN) 10 MG tablet helps her to fall off to sleep  BUT she doesn't stay  asleep thru the night. And wakes up extremly exhausted & tired.

## 2019-10-17 NOTE — Telephone Encounter (Signed)
Attempted call to inform : Changed to ambien cr.  Patient's M.B. is FULL

## 2019-11-10 ENCOUNTER — Ambulatory Visit (HOSPITAL_COMMUNITY): Payer: Self-pay | Admitting: Psychiatry

## 2019-11-10 ENCOUNTER — Other Ambulatory Visit: Payer: Self-pay

## 2019-11-21 ENCOUNTER — Ambulatory Visit (INDEPENDENT_AMBULATORY_CARE_PROVIDER_SITE_OTHER): Payer: Self-pay | Admitting: Psychiatry

## 2019-11-21 ENCOUNTER — Other Ambulatory Visit: Payer: Self-pay

## 2019-11-21 ENCOUNTER — Encounter (HOSPITAL_COMMUNITY): Payer: Self-pay | Admitting: Psychiatry

## 2019-11-21 DIAGNOSIS — F4310 Post-traumatic stress disorder, unspecified: Secondary | ICD-10-CM

## 2019-11-21 MED ORDER — TRAZODONE HCL 100 MG PO TABS: 100.0000 mg | ORAL_TABLET | Freq: Every day | ORAL | 2 refills | Status: DC

## 2019-11-21 MED ORDER — BUPROPION HCL ER (XL) 300 MG PO TB24: 300.0000 mg | ORAL_TABLET | ORAL | 2 refills | Status: DC

## 2019-11-21 MED ORDER — ALPRAZOLAM 1 MG PO TABS: 1.0000 mg | ORAL_TABLET | Freq: Four times a day (QID) | ORAL | 2 refills | Status: DC
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
## 2019-12-31 ENCOUNTER — Emergency Department (HOSPITAL_COMMUNITY)
Admission: EM | Admit: 2019-12-31 | Discharge: 2019-12-31 | Disposition: A | Discharge status: Home / Self Care | Payer: Self-pay | Attending: Emergency Medicine | Admitting: Emergency Medicine

## 2019-12-31 ENCOUNTER — Other Ambulatory Visit: Payer: Self-pay

## 2019-12-31 ENCOUNTER — Encounter (HOSPITAL_COMMUNITY): Payer: Self-pay

## 2019-12-31 ENCOUNTER — Emergency Department (HOSPITAL_COMMUNITY): Payer: Self-pay

## 2019-12-31 DIAGNOSIS — Z79899 Other long term (current) drug therapy: Secondary | ICD-10-CM | POA: Insufficient documentation

## 2019-12-31 DIAGNOSIS — N83201 Unspecified ovarian cyst, right side: Secondary | ICD-10-CM | POA: Insufficient documentation

## 2019-12-31 DIAGNOSIS — Z87891 Personal history of nicotine dependence: Secondary | ICD-10-CM | POA: Insufficient documentation

## 2019-12-31 HISTORY — DX: Unspecified ovarian cyst, unspecified side: N83.209

## 2019-12-31 LAB — URINALYSIS, ROUTINE W REFLEX MICROSCOPIC
Bacteria, UA: NONE SEEN
Bilirubin Urine: NEGATIVE
Glucose, UA: NEGATIVE mg/dL
Ketones, ur: NEGATIVE mg/dL
Nitrite: NEGATIVE
Protein, ur: NEGATIVE mg/dL
Specific Gravity, Urine: 1.005 (ref 1.005–1.030)
pH: 6 (ref 5.0–8.0)

## 2019-12-31 LAB — CBC
HCT: 37.7 % (ref 36.0–46.0)
Hemoglobin: 13 g/dL (ref 12.0–15.0)
MCH: 31.5 pg (ref 26.0–34.0)
MCHC: 34.5 g/dL (ref 30.0–36.0)
MCV: 91.3 fL (ref 80.0–100.0)
Platelets: 287 10*3/uL (ref 150–400)
RBC: 4.13 MIL/uL (ref 3.87–5.11)
RDW: 11.9 % (ref 11.5–15.5)
WBC: 8.9 10*3/uL (ref 4.0–10.5)
nRBC: 0 % (ref 0.0–0.2)

## 2019-12-31 LAB — WET PREP, GENITAL
Clue Cells Wet Prep HPF POC: NONE SEEN
Sperm: NONE SEEN
Trich, Wet Prep: NONE SEEN
Yeast Wet Prep HPF POC: NONE SEEN

## 2019-12-31 LAB — COMPREHENSIVE METABOLIC PANEL
ALT: 13 U/L (ref 0–44)
AST: 15 U/L (ref 15–41)
Albumin: 4.6 g/dL (ref 3.5–5.0)
Alkaline Phosphatase: 52 U/L (ref 38–126)
Anion gap: 12 (ref 5–15)
BUN: 10 mg/dL (ref 6–20)
CO2: 22 mmol/L (ref 22–32)
Calcium: 9.6 mg/dL (ref 8.9–10.3)
Chloride: 104 mmol/L (ref 98–111)
Creatinine, Ser: 0.65 mg/dL (ref 0.44–1.00)
GFR calc Af Amer: >60 mL/min (ref >60)
GFR calc non Af Amer: >60 mL/min (ref >60)
Glucose, Bld: 117 mg/dL — ABNORMAL HIGH (ref 70–99)
Potassium: 3.5 mmol/L (ref 3.5–5.1)
Sodium: 138 mmol/L (ref 135–145)
Total Bilirubin: 0.9 mg/dL (ref 0.3–1.2)
Total Protein: 7.2 g/dL (ref 6.5–8.1)

## 2019-12-31 LAB — LIPASE, BLOOD: Lipase: 26 U/L (ref 11–51)

## 2019-12-31 LAB — HCG, SERUM, QUALITATIVE: Preg, Serum: NEGATIVE

## 2019-12-31 MED ORDER — TRAMADOL HCL 50 MG PO TABS: 50.0000 mg | ORAL_TABLET | Freq: Four times a day (QID) | ORAL | 0 refills | Status: DC | PRN
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
## 2019-12-31 NOTE — Discharge Instructions (Addendum)
Begin taking tramadol as prescribed as needed for pain.  We will call you if your cultures indicate you require further treatment or need to take further action.  Follow-up with your GYN if symptoms or not improving in the next 3 to 4 days, and return to the ER if you develop worsening pain, high fever, bloody stool, or other new and concerning symptoms.

## 2019-12-31 NOTE — ED Triage Notes (Addendum)
Pt reports that she vomited at 3 am , then woke up at 7 am with right lower abdominal pain radiating to lower stomach. States feels like pressure between her legs. Hx of of cyst on right ovary. Denies burning with urination

## 2020-01-01 LAB — GC/CHLAMYDIA PROBE AMP (~~LOC~~) NOT AT ARMC
Chlamydia: NEGATIVE
Comment: NEGATIVE
Comment: NORMAL
Neisseria Gonorrhea: NEGATIVE

## 2020-01-31 ENCOUNTER — Telehealth (INDEPENDENT_AMBULATORY_CARE_PROVIDER_SITE_OTHER): Payer: Self-pay | Admitting: Psychiatry

## 2020-01-31 ENCOUNTER — Encounter (HOSPITAL_COMMUNITY): Payer: Self-pay | Admitting: Psychiatry

## 2020-01-31 ENCOUNTER — Other Ambulatory Visit: Payer: Self-pay

## 2020-01-31 DIAGNOSIS — F4310 Post-traumatic stress disorder, unspecified: Secondary | ICD-10-CM

## 2020-01-31 MED ORDER — BUPROPION HCL ER (XL) 300 MG PO TB24: 300.0000 mg | ORAL_TABLET | ORAL | 2 refills | Status: DC

## 2020-01-31 MED ORDER — ALPRAZOLAM 1 MG PO TABS: 1.0000 mg | ORAL_TABLET | Freq: Four times a day (QID) | ORAL | 2 refills | Status: DC

## 2020-01-31 MED ORDER — MIRTAZAPINE 30 MG PO TABS: 30.0000 mg | ORAL_TABLET | Freq: Every day | ORAL | 2 refills | Status: DC
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
## 2020-03-22 ENCOUNTER — Other Ambulatory Visit: Payer: Self-pay

## 2020-03-22 ENCOUNTER — Encounter (HOSPITAL_COMMUNITY): Payer: Self-pay | Admitting: Psychiatry

## 2020-03-22 ENCOUNTER — Telehealth (INDEPENDENT_AMBULATORY_CARE_PROVIDER_SITE_OTHER): Payer: Self-pay | Admitting: Psychiatry

## 2020-03-22 DIAGNOSIS — F4310 Post-traumatic stress disorder, unspecified: Secondary | ICD-10-CM

## 2020-03-22 MED ORDER — ALPRAZOLAM 1 MG PO TABS: 1.0000 mg | ORAL_TABLET | Freq: Four times a day (QID) | ORAL | 2 refills | Status: DC

## 2020-03-22 MED ORDER — MIRTAZAPINE 30 MG PO TABS: 30.0000 mg | ORAL_TABLET | Freq: Every day | ORAL | 2 refills | Status: DC

## 2020-03-22 MED ORDER — BUPROPION HCL ER (XL) 300 MG PO TB24: 300.0000 mg | ORAL_TABLET | ORAL | 2 refills | Status: DC

## 2020-03-22 NOTE — Progress Notes (Signed)
Virtual Visit via Video Note  I connected with Valerie Rose on 03/22/20 at  9:20 AM EDT by a video enabled telemedicine application and verified that I am speaking with the correct person using two identifiers.   I discussed the limitations of evaluation and management by telemedicine and the availability of in person appointments. The patient expressed understanding and agreed to proceed.    I discussed the assessment and treatment plan with the patient. The patient was provided an opportunity to ask questions and all were answered. The patient agreed with the plan and demonstrated an understanding of the instructions.   The patient was advised to call back or seek an in-person evaluation if the symptoms worsen or if the condition fails to improve as anticipated.  I provided 15 minutes of non-face-to-face time during this encounter. Location: Provider Home, patient home  Valerie Ruder, MD  [facility] MD/PA/NP OP Progress Note  03/22/2020 9:53 AM [name]  MRN:  [id number]  Chief Complaint:  Chief Complaint    Depression; Anxiety; Follow-up     HPI: This patient is a 29 year old single white female who lives with her 70-year-old son in [place].  She is currently unemployed.  The patient returns for follow-up regarding anxiety depression and posttraumatic stress disorder.  The patient had told me last visit that her son's father died of a drug overdose earlier this year.  This has been difficult for all of them.  Even though she and this man were no longer together it was still low and is especially affected her son.  However over time things seem to be getting better.  Her son has been engaged in a lot of activities which has helped.  She is spending all of her time with him this summer.  Once he returns to school in the fall she is going to try to find employment.  We did add mirtazapine to her regimen at night and it seems to be helping most the time.  Sometimes she wakes up at night  in a panic attack.  I urged her to stay one of her Xanax tablets to use for this.  She states her mood is a lot better and she denies suicidal ideation she has been able to focus and get through all of her tasks throughout the day. Visit Diagnosis:    ICD-10-CM   1. PTSD (post-traumatic stress disorder)  F43.10     Past Psychiatric History: none  Past Medical History:  Past Medical History:  Diagnosis Date  . Anxiety   . Chronic daily headache   . Depression   . Ovarian cyst     Past Surgical History:  Procedure Laterality Date  . CESAREAN SECTION    . I & D EXTREMITY Left 03/04/2014   Procedure: IRRIGATION AND DEBRIDEMENT EXTREMITY, REPAIR OF MULTIPLE LACERATIONS, HIP AND KNEE;  Surgeon: Cammy Copa, MD;  Location: [facility] OR;  Service: Orthopedics;  Laterality: Left;  Wounds located at left hip and knee.  . I & D EXTREMITY Left 03/06/2014   Procedure: IRRIGATION AND DEBRIDEMENT EXTREMITY WITH ABX BEAD PLACEMENT WITH DELAYED PRIMARY CLOSURE.;  Surgeon: Cammy Copa, MD;  Location: [facility] OR;  Service: Orthopedics;  Laterality: Left;  . LEG SURGERY      Family Psychiatric History: See below  Family History:  Family History  Problem Relation Age of Onset  . Depression Mother   . Anxiety disorder Mother   . Post-traumatic stress disorder Sister   . Alcohol abuse  Maternal Grandmother   . Depression Maternal Grandmother   . Sudden Cardiac Death Neg Hx     Social History:  Social History   Socioeconomic History  . Marital status: Single    Spouse name: Not on file  . Number of children: Not on file  . Years of education: Not on file  . Highest education level: Not on file  Occupational History  . Not on file  Tobacco Use  . Smoking status: Former Games developer  . Smokeless tobacco: Never Used  Vaping Use  . Vaping Use: Never used  Substance and Sexual Activity  . Alcohol use: No    Comment: no alcohol since admit 06/21/16  . Drug use: No  . Sexual activity: Not  Currently    Birth control/protection: I.U.D.  Other Topics Concern  . Not on file  Social History Narrative  . Not on file   Social Determinants of Health   Financial Resource Strain:   . Difficulty of Paying Living Expenses:   Food Insecurity:   . Worried About Programme researcher, broadcasting/film/video in the Last Year:   . Barista in the Last Year:   Transportation Needs:   . Freight forwarder (Medical):   Marland Kitchen Lack of Transportation (Non-Medical):   Physical Activity:   . Days of Exercise per Week:   . Minutes of Exercise per Session:   Stress:   . Feeling of Stress :   Social Connections:   . Frequency of Communication with Friends and Family:   . Frequency of Social Gatherings with Friends and Family:   . Attends Religious Services:   . Active Member of Clubs or Organizations:   . Attends Banker Meetings:   Marland Kitchen Marital Status:     Allergies: No Known Allergies  Metabolic Disorder Labs: No results found for: HGBA1C, MPG No results found for: PROLACTIN No results found for: CHOL, TRIG, HDL, CHOLHDL, VLDL, LDLCALC Lab Results  Component Value Date   TSH 0.305 (L) 06/22/2016    Therapeutic Level Labs: No results found for: LITHIUM No results found for: VALPROATE No components found for:  CBMZ  Current Medications: Current Outpatient Medications  Medication Sig Dispense Refill  . acetaminophen (TYLENOL) 500 MG tablet Take 500 mg by mouth every 6 (six) hours as needed for mild pain or moderate pain.    Marland Kitchen ALPRAZolam (XANAX) 1 MG tablet Take 1 tablet (1 mg total) by mouth 4 (four) times daily. 120 tablet 2  . buPROPion (WELLBUTRIN XL) 300 MG 24 hr tablet Take 1 tablet (300 mg total) by mouth every morning. 30 tablet 2  . ibuprofen (ADVIL,MOTRIN) 600 MG tablet Take 1 tablet (600 mg total) by mouth every 6 (six) hours as needed. 30 tablet 0  . mirtazapine (REMERON) 30 MG tablet Take 1 tablet (30 mg total) by mouth at bedtime. 30 tablet 2  . ondansetron (ZOFRAN-ODT)  4 MG disintegrating tablet Take 1 tablet (4 mg total) by mouth every 8 (eight) hours as needed for nausea or vomiting. 8 tablet 0  . traMADol (ULTRAM) 50 MG tablet Take 1 tablet (50 mg total) by mouth every 6 (six) hours as needed. 12 tablet 0   No current facility-administered medications for this visit.     Musculoskeletal: Strength & Muscle Tone: within normal limits Gait & Station: normal Patient leans: N/A  Psychiatric Specialty Exam: Review of Systems  Psychiatric/Behavioral: Positive for sleep disturbance. The patient is nervous/anxious.   All other systems reviewed and  are negative.   There were no vitals taken for this visit.There is no height or weight on file to calculate BMI.  General Appearance: Casual and Fairly Groomed  Eye Contact:  Good  Speech:  Clear and Coherent  Volume:  Normal  Mood:  Anxious and Euthymic  Affect:  Appropriate and Congruent  Thought Process:  Goal Directed  Orientation:  Full (Time, Place, and Person)  Thought Content: Rumination   Suicidal Thoughts:  No  Homicidal Thoughts:  No  Memory:  Immediate;   Good Recent;   Good Remote;   Fair  Judgement:  Good  Insight:  Good  Psychomotor Activity:  Normal  Concentration:  Concentration: Good and Attention Span: Good  Recall:  Good  Fund of Knowledge: Good  Language: Good  Akathisia:  No  Handed:  Right  AIMS (if indicated): not done  Assets:  Communication Skills Desire for Improvement Physical Health Resilience Social Support Talents/Skills  ADL's:  Intact  Cognition: WNL  Sleep:  Fair   Screenings:   Assessment and Plan: This patient is a 29 year old female with a history of posttraumatic stress disorder depression anxiety and insomnia.  She is doing better with the mirtazapine 30 mg at bedtime.  She will continue this as well as Wellbutrin XL 300 mg daily for depression and Xanax 1 mg 4 times daily for anxiety.  She will return to see me in 3 months   Valerie Ruder,  MD 03/22/2020, 9:53 AM

## 2020-05-14 ENCOUNTER — Telehealth (HOSPITAL_COMMUNITY): Payer: Self-pay | Admitting: Psychiatry

## 2020-05-14 NOTE — Telephone Encounter (Signed)
Called patient to schedule f/u appt, left detailed message

## 2020-05-25 ENCOUNTER — Other Ambulatory Visit (HOSPITAL_COMMUNITY): Payer: Self-pay | Admitting: Psychiatry

## 2020-05-27 NOTE — Telephone Encounter (Signed)
Call for appt

## 2020-06-20 ENCOUNTER — Other Ambulatory Visit: Payer: Self-pay

## 2020-06-20 ENCOUNTER — Telehealth (INDEPENDENT_AMBULATORY_CARE_PROVIDER_SITE_OTHER): Payer: Self-pay | Admitting: Psychiatry

## 2020-06-20 ENCOUNTER — Encounter (HOSPITAL_COMMUNITY): Payer: Self-pay | Admitting: Psychiatry

## 2020-06-20 DIAGNOSIS — F4310 Post-traumatic stress disorder, unspecified: Secondary | ICD-10-CM

## 2020-06-20 MED ORDER — BUPROPION HCL ER (XL) 300 MG PO TB24: 300.0000 mg | ORAL_TABLET | ORAL | 2 refills | Status: DC

## 2020-06-20 MED ORDER — MIRTAZAPINE 45 MG PO TABS: 45.0000 mg | ORAL_TABLET | Freq: Every day | ORAL | 2 refills | Status: DC

## 2020-06-20 MED ORDER — ALPRAZOLAM 1 MG PO TABS: 1.0000 mg | ORAL_TABLET | Freq: Four times a day (QID) | ORAL | 2 refills | Status: DC

## 2020-06-20 NOTE — Progress Notes (Signed)
Virtual Visit via Video Note  I connected with Valerie Rose on 06/20/20 at  1:20 PM EDT by a video enabled telemedicine application and verified that I am speaking with the correct person using two identifiers.   I discussed the limitations of evaluation and management by telemedicine and the availability of in person appointments. The patient expressed understanding and agreed to proceed.     I discussed the assessment and treatment plan with the patient. The patient was provided an opportunity to ask questions and all were answered. The patient agreed with the plan and demonstrated an understanding of the instructions.   The patient was advised to call back or seek an in-person evaluation if the symptoms worsen or if the condition fails to improve as anticipated.  I provided 15 minutes of non-face-to-face time during this encounter. Location: Provider office, patient home  Valerie Ruder, MD  [facility] MD/PA/NP OP Progress Note  06/20/2020 1:39 PM Valerie Rose  MRN:  [id number]  Chief Complaint:  Chief Complaint    Depression; Anxiety; Follow-up     HPI: This patient is a 29 year old single white female who lives with her 57-year-old son in [place].  She is currently unemployed.  The patient returns after 3 months regarding anxiety depression and posttraumatic stress disorder.  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  She is glad she is able to get something that would keep her home so she could be there when her son gets home from school.  She also states that her grandfather died last month and this has been very difficult.  She lives with her grandparents during her teenage years and she was very close to her grandfather.  She had a bad panic attack shortly after his death but is doing somewhat better now.  She still having some trouble with sleep and nightmares.  Prazosin did not help and Remeron has helped a little bit.  I  suggested we go up a bit on the dose to see if this will help more with her sleep and agitation at night. Visit Diagnosis:    ICD-10-CM   1. PTSD (post-traumatic stress disorder)  F43.10     Past Psychiatric History: none  Past Medical History:  Past Medical History:  Diagnosis Date  . Anxiety   . Chronic daily headache   . Depression   . Ovarian cyst     Past Surgical History:  Procedure Laterality Date  . CESAREAN SECTION    . I & D EXTREMITY Left 03/04/2014   Procedure: IRRIGATION AND DEBRIDEMENT EXTREMITY, REPAIR OF MULTIPLE LACERATIONS, HIP AND KNEE;  Surgeon: Cammy Copa, MD;  [facility]: [facility];  Service: Orthopedics;  Laterality: Left;  Wounds located at left hip and knee.  . I & D EXTREMITY Left 03/06/2014   Procedure: IRRIGATION AND DEBRIDEMENT EXTREMITY WITH ABX BEAD PLACEMENT WITH DELAYED PRIMARY CLOSURE.;  Surgeon: Cammy Copa, MD;  Location: [facility] OR;  Service: Orthopedics;  Laterality: Left;  . LEG SURGERY      Family Psychiatric History: see below  Family History:  Family History  Problem Relation Age of Onset  . Depression Mother   . Anxiety disorder Mother   . Post-traumatic stress disorder Sister   . Alcohol abuse Maternal Grandmother   . Depression Maternal Grandmother   . Sudden Cardiac Death Neg Hx     Social History:  Social History   Socioeconomic History  .  Marital status: Single    Spouse name: Not on file  . Number of children: Not on file  . Years of education: Not on file  . Highest education level: Not on file  Occupational History  . Not on file  Tobacco Use  . Smoking status: Former Games developer  . Smokeless tobacco: Never Used  Vaping Use  . Vaping Use: Never used  Substance and Sexual Activity  . Alcohol use: No    Comment: no alcohol since admit 06/21/16  . Drug use: No  . Sexual activity: Not Currently    Birth control/protection: I.U.D.  Other Topics Concern  . Not on file  Social History Narrative  . Not on file    Social Determinants of Health   Financial Resource Strain:   . Difficulty of Paying Living Expenses: Not on file  Food Insecurity:   . Worried About Programme researcher, broadcasting/film/video in the Last Year: Not on file  . Ran Out of Food in the Last Year: Not on file  Transportation Needs:   . Lack of Transportation (Medical): Not on file  . Lack of Transportation (Non-Medical): Not on file  Physical Activity:   . Days of Exercise per Week: Not on file  . Minutes of Exercise per Session: Not on file  Stress:   . Feeling of Stress : Not on file  Social Connections:   . Frequency of Communication with Friends and Family: Not on file  . Frequency of Social Gatherings with Friends and Family: Not on file  . Attends Religious Services: Not on file  . Active Member of Clubs or Organizations: Not on file  . Attends Banker Meetings: Not on file  . Marital Status: Not on file    Allergies: No Known Allergies  Metabolic Disorder Labs: No results found for: HGBA1C, MPG No results found for: PROLACTIN No results found for: CHOL, TRIG, HDL, CHOLHDL, VLDL, LDLCALC Lab Results  Component Value Date   TSH 0.305 (L) 06/22/2016    Therapeutic Level Labs: No results found for: LITHIUM No results found for: VALPROATE No components found for:  CBMZ  Current Medications: Current Outpatient Medications  Medication Sig Dispense Refill  . acetaminophen (TYLENOL) 500 MG tablet Take 500 mg by mouth every 6 (six) hours as needed for mild pain or moderate pain.    Marland Kitchen ALPRAZolam (XANAX) 1 MG tablet Take 1 tablet (1 mg total) by mouth 4 (four) times daily. 120 tablet 2  . buPROPion (WELLBUTRIN XL) 300 MG 24 hr tablet Take 1 tablet (300 mg total) by mouth every morning. 30 tablet 2  . ibuprofen (ADVIL,MOTRIN) 600 MG tablet Take 1 tablet (600 mg total) by mouth every 6 (six) hours as needed. 30 tablet 0  . mirtazapine (REMERON) 45 MG tablet Take 1 tablet (45 mg total) by mouth at bedtime. 30 tablet 2  .  ondansetron (ZOFRAN-ODT) 4 MG disintegrating tablet Take 1 tablet (4 mg total) by mouth every 8 (eight) hours as needed for nausea or vomiting. 8 tablet 0  . traMADol (ULTRAM) 50 MG tablet Take 1 tablet (50 mg total) by mouth every 6 (six) hours as needed. 12 tablet 0   No current facility-administered medications for this visit.     Musculoskeletal: Strength & Muscle Tone: within normal limits Gait & Station: normal Patient leans: N/A  Psychiatric Specialty Exam: Review of Systems  Psychiatric/Behavioral: Positive for sleep disturbance.  All other systems reviewed and are negative.   There were no vitals  taken for this visit.There is no height or weight on file to calculate BMI.  General Appearance: Casual and Fairly Groomed  Eye Contact:  Good  Speech:  Clear and Coherent  Volume:  Normal  Mood:  Anxious and Euthymic  Affect:  Appropriate and Congruent  Thought Process:  Goal Directed  Orientation:  Full (Time, Place, and Person)  Thought Content: Rumination   Suicidal Thoughts:  No  Homicidal Thoughts:  No  Memory:  Immediate;   Good Recent;   Good Remote;   Good  Judgement:  Good  Insight:  Good  Psychomotor Activity:  Normal  Concentration:  Concentration: Good and Attention Span: Good  Recall:  Good  Fund of Knowledge: Good  Language: Good  Akathisia:  No  Handed:  Right  AIMS (if indicated): not done  Assets:  Communication Skills Desire for Improvement Physical Health Resilience  ADL's:  Intact  Cognition: WNL  Sleep:  Poor   Screenings:   Assessment and Plan: This patient is a 29 year old female with a history of posttraumatic stress disorder depression anxiety and insomnia.  Since she is having more trouble with his autoimmune awakening since her grandfather died we will increase mirtazapine to 45 mg at bedtime.  She will also continue Wellbutrin XL 300 mg daily for depression and Xanax 1 mg 4 times daily for anxiety.  She will return to see me in 3  months   Valerie Ruder, MD 06/20/2020, 1:39 PM

## 2020-07-02 ENCOUNTER — Telehealth (HOSPITAL_COMMUNITY): Payer: Self-pay | Admitting: Psychiatry

## 2020-07-02 NOTE — Telephone Encounter (Signed)
Called pt to schedule F/U appt, left detailed vm

## 2020-08-30 ENCOUNTER — Telehealth (HOSPITAL_COMMUNITY): Payer: Self-pay | Admitting: Psychiatry

## 2020-08-30 NOTE — Telephone Encounter (Signed)
Called to reschedule 1/7 appt with Dr. Tenny Craw, no vm available

## 2020-09-14 ENCOUNTER — Other Ambulatory Visit (HOSPITAL_COMMUNITY): Payer: Self-pay | Admitting: Psychiatry

## 2020-09-16 NOTE — Telephone Encounter (Signed)
Call for appt, no refill without appt

## 2020-09-20 ENCOUNTER — Telehealth (HOSPITAL_COMMUNITY): Payer: Self-pay | Admitting: Psychiatry

## 2020-09-23 ENCOUNTER — Other Ambulatory Visit: Payer: Self-pay

## 2020-09-23 ENCOUNTER — Encounter (HOSPITAL_COMMUNITY): Payer: Self-pay | Admitting: Psychiatry

## 2020-09-23 ENCOUNTER — Telehealth (INDEPENDENT_AMBULATORY_CARE_PROVIDER_SITE_OTHER): Payer: Self-pay | Admitting: Psychiatry

## 2020-09-23 DIAGNOSIS — F4310 Post-traumatic stress disorder, unspecified: Secondary | ICD-10-CM

## 2020-09-23 MED ORDER — MIRTAZAPINE 45 MG PO TABS: 45.0000 mg | ORAL_TABLET | Freq: Every day | ORAL | 2 refills | Status: DC

## 2020-09-23 MED ORDER — ALPRAZOLAM 1 MG PO TABS: 1.0000 mg | ORAL_TABLET | Freq: Four times a day (QID) | ORAL | 2 refills | Status: DC

## 2020-09-23 MED ORDER — BUPROPION HCL ER (XL) 300 MG PO TB24: 300.0000 mg | ORAL_TABLET | ORAL | 2 refills | Status: DC

## 2020-09-23 NOTE — Progress Notes (Signed)
Virtual Visit via Telephone Note  I connected with Valerie Rose on 09/23/20 at  9:00 AM EST by telephone and verified that I am speaking with the correct person using two identifiers.  Location: Patient: home Provider: home   I discussed the limitations, risks, security and privacy concerns of performing an evaluation and management service by telephone and the availability of in person appointments. I also discussed with the patient that there may be a patient responsible charge related to this service. The patient expressed understanding and agreed to proceed.       I discussed the assessment and treatment plan with the patient. The patient was provided an opportunity to ask questions and all were answered. The patient agreed with the plan and demonstrated an understanding of the instructions.   The patient was advised to call back or seek an in-person evaluation if the symptoms worsen or if the condition fails to improve as anticipated.  I provided 15 minutes of non-face-to-face time during this encounter.   [name], MD  [facility] MD/PA/NP OP Progress Note  09/23/2020 9:17 AM Valerie Rose  MRN:  [id number]  Chief Complaint:  Chief Complaint    Anxiety; Depression; Follow-up     HPI: This patient is a 30 year old single white female who lives with her son in [employer].  She is currently working for a [employer] in the office.  The patient returns after 3 months regarding anxiety depression and posttraumatic stress disorder.  I noted that the patient had been seen at the [facility] emergency room shortly after our last visit.  She did come in acutely intoxicated with a blood alcohol level of 194.  She said that she had been at a party and had drank some liquor.  She states that she has rarely done this in her life.  She began hyperventilating.  Her drug screen was positive for benzodiazepines as she is prescribed Xanax.  She claims she had not taken any that day but had  taken some the day before..  The doctor at the emergency room warned her about the combination is denied.  She states that she has not been drinking since then.  She does state that the Xanax helps considerably with her panic disorder.  We discussed at length the dangers of combining benzodiazepines with alcohol and she voices understanding.  I told her in the future we would try to cut back the quantity and she agreed.  She denies being depressed or suicidal and is sleeping very well. Visit Diagnosis:    ICD-10-CM   1. PTSD (post-traumatic stress disorder)  F43.10     Past Psychiatric History: none  Past Medical History:  Past Medical History:  Diagnosis Date  . Anxiety   . Chronic daily headache   . Depression   . Ovarian cyst     Past Surgical History:  Procedure Laterality Date  . CESAREAN SECTION    . I & D EXTREMITY Left 03/04/2014   Procedure: IRRIGATION AND DEBRIDEMENT EXTREMITY, REPAIR OF MULTIPLE LACERATIONS, HIP AND KNEE;  Surgeon: Cammy Copa, MD;  Location: [facility] OR;  Service: Orthopedics;  Laterality: Left;  Wounds located at left hip and knee.  . I & D EXTREMITY Left 03/06/2014   Procedure: IRRIGATION AND DEBRIDEMENT EXTREMITY WITH ABX BEAD PLACEMENT WITH DELAYED PRIMARY CLOSURE.;  Surgeon: Cammy Copa, MD;  Location: [facility] OR;  Service: Orthopedics;  Laterality: Left;  . LEG SURGERY      Family Psychiatric History: see  below  Family History:  Family History  Problem Relation Age of Onset  . Depression Mother   . Anxiety disorder Mother   . Post-traumatic stress disorder Sister   . Alcohol abuse Maternal Grandmother   . Depression Maternal Grandmother   . Sudden Cardiac Death Neg Hx     Social History:  Social History   Socioeconomic History  . Marital status: Single    Spouse name: Not on file  . Number of children: Not on file  . Years of education: Not on file  . Highest education level: Not on file  Occupational History  . Not on file   Tobacco Use  . Smoking status: Former Games developer  . Smokeless tobacco: Never Used  Vaping Use  . Vaping Use: Never used  Substance and Sexual Activity  . Alcohol use: No    Comment: no alcohol since admit 06/21/16  . Drug use: No  . Sexual activity: Not Currently    Birth control/protection: I.U.D.  Other Topics Concern  . Not on file  Social History Narrative  . Not on file   Social Determinants of Health   Financial Resource Strain: Not on file  Food Insecurity: Not on file  Transportation Needs: Not on file  Physical Activity: Not on file  Stress: Not on file  Social Connections: Not on file    Allergies: No Known Allergies  Metabolic Disorder Labs: No results found for: HGBA1C, MPG No results found for: PROLACTIN No results found for: CHOL, TRIG, HDL, CHOLHDL, VLDL, LDLCALC Lab Results  Component Value Date   TSH 0.305 (L) 06/22/2016    Therapeutic Level Labs: No results found for: LITHIUM No results found for: VALPROATE No components found for:  CBMZ  Current Medications: Current Outpatient Medications  Medication Sig Dispense Refill  . acetaminophen (TYLENOL) 500 MG tablet Take 500 mg by mouth every 6 (six) hours as needed for mild pain or moderate pain.    Marland Kitchen ALPRAZolam (XANAX) 1 MG tablet Take 1 tablet (1 mg total) by mouth 4 (four) times daily. 120 tablet 2  . buPROPion (WELLBUTRIN XL) 300 MG 24 hr tablet Take 1 tablet (300 mg total) by mouth every morning. 30 tablet 2  . ibuprofen (ADVIL,MOTRIN) 600 MG tablet Take 1 tablet (600 mg total) by mouth every 6 (six) hours as needed. 30 tablet 0  . mirtazapine (REMERON) 45 MG tablet Take 1 tablet (45 mg total) by mouth at bedtime. 30 tablet 2  . ondansetron (ZOFRAN-ODT) 4 MG disintegrating tablet Take 1 tablet (4 mg total) by mouth every 8 (eight) hours as needed for nausea or vomiting. 8 tablet 0  . traMADol (ULTRAM) 50 MG tablet Take 1 tablet (50 mg total) by mouth every 6 (six) hours as needed. 12 tablet 0   No  current facility-administered medications for this visit.     Musculoskeletal: Strength & Muscle Tone: within normal limits Gait & Station: normal Patient leans: N/A  Psychiatric Specialty Exam: Review of Systems  All other systems reviewed and are negative.   There were no vitals taken for this visit.There is no height or weight on file to calculate BMI.  General Appearance: NA  Eye Contact:  NA  Speech:  Clear and Coherent  Volume:  Normal  Mood:  Euthymic  Affect:  NA  Thought Process:  Goal Directed  Orientation:  Full (Time, Place, and Person)  Thought Content: WDL   Suicidal Thoughts:  No  Homicidal Thoughts:  No  Memory:  Immediate;  Good Recent;   Good Remote;   Good  Judgement:  Fair  Insight:  Fair  Psychomotor Activity:  Normal  Concentration:  Concentration: Good and Attention Span: Good  Recall:  Good  Fund of Knowledge: Good  Language: Good  Akathisia:  No  Handed:  Right  AIMS (if indicated): not done  Assets:  Communication Skills Desire for Improvement Physical Health Resilience Social Support Talents/Skills  ADL's:  Intact  Cognition: WNL  Sleep:  Good   Screenings:   Assessment and Plan: This patient is a 30 year old female with a history of posttraumatic stress disorder depression anxiety and insomnia.  I am concerned about her poor judgment and drinking too much and also having benzodiazepines in her system.  We discussed this at length and she states that her drinking has stopped.  For now we will continue mirtazapine 45 mg at bedtime for sleep and anxiety, Wellbutrin XL 300 mg daily for depression and Xanax 1 mg 4 times daily for anxiety.  I informed her that in the future we will try to taper down the Xanax.  She will return to see me in 3 months   [name], MD 09/23/2020, 9:17 AM

## 2020-12-08 ENCOUNTER — Other Ambulatory Visit (HOSPITAL_COMMUNITY): Payer: Self-pay | Admitting: Psychiatry

## 2020-12-17 ENCOUNTER — Other Ambulatory Visit: Payer: Self-pay

## 2020-12-17 ENCOUNTER — Telehealth (INDEPENDENT_AMBULATORY_CARE_PROVIDER_SITE_OTHER): Payer: Self-pay | Admitting: Psychiatry

## 2020-12-17 ENCOUNTER — Encounter (HOSPITAL_COMMUNITY): Payer: Self-pay | Admitting: Psychiatry

## 2020-12-17 DIAGNOSIS — F4310 Post-traumatic stress disorder, unspecified: Secondary | ICD-10-CM

## 2020-12-17 MED ORDER — BUPROPION HCL ER (XL) 300 MG PO TB24: 300.0000 mg | ORAL_TABLET | ORAL | 2 refills | Status: DC

## 2020-12-17 MED ORDER — MIRTAZAPINE 45 MG PO TABS: 45.0000 mg | ORAL_TABLET | Freq: Every day | ORAL | 2 refills | Status: DC

## 2020-12-17 MED ORDER — ALPRAZOLAM 1 MG PO TABS: 1.0000 mg | ORAL_TABLET | Freq: Four times a day (QID) | ORAL | 2 refills | Status: DC

## 2020-12-17 NOTE — Progress Notes (Signed)
Virtual Visit via Telephone Note  I connected with Valerie Rose on 12/17/20 at  8:40 AM EDT by telephone and verified that I am speaking with the correct person using two identifiers.  Location: Patient: home Provider: home   I discussed the limitations, risks, security and privacy concerns of performing an evaluation and management service by telephone and the availability of in person appointments. I also discussed with the patient that there may be a patient responsible charge related to this service. The patient expressed understanding and agreed to proceed.    I discussed the assessment and treatment plan with the patient. The patient was provided an opportunity to ask questions and all were answered. The patient agreed with the plan and demonstrated an understanding of the instructions.   The patient was advised to call back or seek an in-person evaluation if the symptoms worsen or if the condition fails to improve as anticipated.  I provided 15 minutes of non-face-to-face time during this encounter.   [name], MD  [facility] MD/PA/NP OP Progress Note  12/17/2020 8:59 AM Valerie Rose  MRN:  [id number]  Chief Complaint:  Chief Complaint    Depression; Anxiety; Follow-up     HPI: This patient is a 30 year old single white female who lives with her son in [place].  She is currently working for a [employer] in the office.  The patient returns after 3 months regarding anxiety depression and posttraumatic stress disorder  The patient states that overall she is doing okay.  Her sons father died of a drug overdose in 2020-12-05and is still casts a pall on their lives.  Her son especially still feels sad about it.  He is almost 29 years old.  However she is keeping him very active.  He is in a small back to school and feels comfortable there.  He is playing into baseball teams and she is taking them to all the practices and games.  She is getting a lot of family  support.  She states her mood is generally fairly good.  At times she has "bad days."  However she denies suicidal ideation.  She is enjoying her job and she is sleeping well.  Xanax continues to control her anxiety.  She is staying away from alcohol   Visit Diagnosis:    ICD-10-CM   1. PTSD (post-traumatic stress disorder)  F43.10     Past Psychiatric History: none  Past Medical History:  Past Medical History:  Diagnosis Date  . Anxiety   . Chronic daily headache   . Depression   . Ovarian cyst     Past Surgical History:  Procedure Laterality Date  . CESAREAN SECTION    . I & D EXTREMITY Left 03/04/2014   Procedure: IRRIGATION AND DEBRIDEMENT EXTREMITY, REPAIR OF MULTIPLE LACERATIONS, HIP AND KNEE;  Surgeon: Cammy Copa, MD;  Location: [facility] OR;  Service: Orthopedics;  Laterality: Left;  Wounds located at left hip and knee.  . I & D EXTREMITY Left 03/06/2014   Procedure: IRRIGATION AND DEBRIDEMENT EXTREMITY WITH ABX BEAD PLACEMENT WITH DELAYED PRIMARY CLOSURE.;  Surgeon: Cammy Copa, MD;  [facility]: [facility] OR;  Service: Orthopedics;  Laterality: Left;  . LEG SURGERY      Family Psychiatric History: see below  Family History:  Family History  Problem Relation Age of Onset  . Depression Mother   . Anxiety disorder Mother   . Post-traumatic stress disorder Sister   . Alcohol abuse Maternal Grandmother   .  Depression Maternal Grandmother   . Sudden Cardiac Death Neg Hx     Social History:  Social History   Socioeconomic History  . Marital status: Single    Spouse name: Not on file  . Number of children: Not on file  . Years of education: Not on file  . Highest education level: Not on file  Occupational History  . Not on file  Tobacco Use  . Smoking status: Former Games developer  . Smokeless tobacco: Never Used  Vaping Use  . Vaping Use: Never used  Substance and Sexual Activity  . Alcohol use: No    Comment: no alcohol since admit 06/21/16  . Drug use: No  .  Sexual activity: Not Currently    Birth control/protection: I.U.D.  Other Topics Concern  . Not on file  Social History Narrative  . Not on file   Social Determinants of Health   Financial Resource Strain: Not on file  Food Insecurity: Not on file  Transportation Needs: Not on file  Physical Activity: Not on file  Stress: Not on file  Social Connections: Not on file    Allergies: No Known Allergies  Metabolic Disorder Labs: No results found for: HGBA1C, MPG No results found for: PROLACTIN No results found for: CHOL, TRIG, HDL, CHOLHDL, VLDL, LDLCALC Lab Results  Component Value Date   TSH 0.305 (L) 06/22/2016    Therapeutic Level Labs: No results found for: LITHIUM No results found for: VALPROATE No components found for:  CBMZ  Current Medications: Current Outpatient Medications  Medication Sig Dispense Refill  . acetaminophen (TYLENOL) 500 MG tablet Take 500 mg by mouth every 6 (six) hours as needed for mild pain or moderate pain.    Marland Kitchen ALPRAZolam (XANAX) 1 MG tablet Take 1 tablet (1 mg total) by mouth 4 (four) times daily. 120 tablet 2  . buPROPion (WELLBUTRIN XL) 300 MG 24 hr tablet Take 1 tablet (300 mg total) by mouth every morning. 30 tablet 2  . ibuprofen (ADVIL,MOTRIN) 600 MG tablet Take 1 tablet (600 mg total) by mouth every 6 (six) hours as needed. 30 tablet 0  . mirtazapine (REMERON) 45 MG tablet Take 1 tablet (45 mg total) by mouth at bedtime. 30 tablet 2  . ondansetron (ZOFRAN-ODT) 4 MG disintegrating tablet Take 1 tablet (4 mg total) by mouth every 8 (eight) hours as needed for nausea or vomiting. 8 tablet 0  . traMADol (ULTRAM) 50 MG tablet Take 1 tablet (50 mg total) by mouth every 6 (six) hours as needed. 12 tablet 0   No current facility-administered medications for this visit.     Musculoskeletal: Strength & Muscle Tone: within normal limits Gait & Station: normal Patient leans: N/A  Psychiatric Specialty Exam: Review of Systems  All other  systems reviewed and are negative.   There were no vitals taken for this visit.There is no height or weight on file to calculate BMI.  General Appearance: NA  Eye Contact:  NA  Speech:  Clear and Coherent  Volume:  Normal  Mood:  Euthymic  Affect:  NA  Thought Process:  Goal Directed  Orientation:  Full (Time, Place, and Person)  Thought Content: Rumination   Suicidal Thoughts:  No  Homicidal Thoughts:  No  Memory:  Immediate;   Good Recent;   Good Remote;   Good  Judgement:  Good  Insight:  Good  Psychomotor Activity:  Normal  Concentration:  Concentration: Good and Attention Span: Good  Recall:  Good  Fund of  Knowledge: Good  Language: Good  Akathisia:  No  Handed:  Right  AIMS (if indicated): not done  Assets:  Communication Skills Desire for Improvement Physical Health Resilience Social Support Talents/Skills  ADL's:  Intact  Cognition: WNL  Sleep:  Good   Screenings: PHQ2-9   Flowsheet Row Video Visit [facility]17/2020 [facility]  PHQ-2 Total Score 1    Flowsheet Row Video Visit from 12/17/2020 in [facility]  C-SSRS RISK CATEGORY No Risk       Assessment and Plan: This patient is a 30 year old female with a history of posttraumatic stress disorder depression anxiety and insomnia.  Fortunately she has not returned to any drinking behavior.  She will continue mirtazapine 45 mg at bedtime for sleep and anxiety, Wellbutrin XL 300 mg daily for depression and Xanax 1 mg 4 times daily for anxiety.  She will return to see me in 3 months   [name], MD 12/17/2020, 8:59 AM

## 2020-12-26 ENCOUNTER — Encounter (HOSPITAL_COMMUNITY): Payer: Self-pay

## 2021-02-25 ENCOUNTER — Telehealth (HOSPITAL_COMMUNITY): Payer: Self-pay | Admitting: *Deleted

## 2021-02-25 NOTE — Telephone Encounter (Signed)
Patient is needing refills for all of his medications provider prescribes for him. Patient appt was cancelled due to provider being on medical leave. Message was left to call office to resch appt.  

## 2021-02-26 ENCOUNTER — Other Ambulatory Visit (HOSPITAL_COMMUNITY): Payer: Self-pay | Admitting: Psychiatry

## 2021-02-26 MED ORDER — MIRTAZAPINE 45 MG PO TABS: 45.0000 mg | ORAL_TABLET | Freq: Every day | ORAL | 2 refills | Status: DC

## 2021-02-26 MED ORDER — BUPROPION HCL ER (XL) 300 MG PO TB24: 300.0000 mg | ORAL_TABLET | ORAL | 2 refills | Status: DC

## 2021-02-26 MED ORDER — ALPRAZOLAM 1 MG PO TABS: 1.0000 mg | ORAL_TABLET | Freq: Four times a day (QID) | ORAL | 2 refills | Status: DC

## 2021-02-26 NOTE — Telephone Encounter (Signed)
sent 

## 2021-03-18 ENCOUNTER — Telehealth (HOSPITAL_COMMUNITY): Payer: Self-pay | Admitting: Psychiatry

## 2021-04-10 ENCOUNTER — Telehealth (HOSPITAL_COMMUNITY): Payer: Self-pay | Admitting: Psychiatry

## 2021-04-27 ENCOUNTER — Other Ambulatory Visit (HOSPITAL_COMMUNITY): Payer: Self-pay | Admitting: Psychiatry

## 2021-05-01 ENCOUNTER — Encounter (HOSPITAL_COMMUNITY): Payer: Self-pay | Admitting: Psychiatry

## 2021-05-01 ENCOUNTER — Other Ambulatory Visit: Payer: Self-pay

## 2021-05-01 ENCOUNTER — Telehealth (INDEPENDENT_AMBULATORY_CARE_PROVIDER_SITE_OTHER): Payer: Self-pay | Admitting: Psychiatry

## 2021-05-01 DIAGNOSIS — F4310 Post-traumatic stress disorder, unspecified: Secondary | ICD-10-CM

## 2021-05-01 MED ORDER — BUPROPION HCL ER (XL) 300 MG PO TB24: 300.0000 mg | ORAL_TABLET | ORAL | 2 refills | Status: DC

## 2021-05-01 MED ORDER — MIRTAZAPINE 45 MG PO TABS: 45.0000 mg | ORAL_TABLET | Freq: Every day | ORAL | 2 refills | Status: DC

## 2021-05-01 MED ORDER — ALPRAZOLAM 1 MG PO TABS: 1.0000 mg | ORAL_TABLET | Freq: Four times a day (QID) | ORAL | 2 refills | Status: DC

## 2021-05-01 NOTE — Progress Notes (Signed)
Virtual Visit via Telephone Note  I connected with Valerie Rose on 05/01/21 at  2:20 PM EDT by telephone and verified that I am speaking with the correct person using two identifiers.  Location: Patient: home Provider: home office   I discussed the limitations, risks, security and privacy concerns of performing an evaluation and management service by telephone and the availability of in person appointments. I also discussed with the patient that there may be a patient responsible charge related to this service. The patient expressed understanding and agreed to proceed.      I discussed the assessment and treatment plan with the patient. The patient was provided an opportunity to ask questions and all were answered. The patient agreed with the plan and demonstrated an understanding of the instructions.   The patient was advised to call back or seek an in-person evaluation if the symptoms worsen or if the condition fails to improve as anticipated.  I provided 15 minutes of non-face-to-face time during this encounter.   [name], MD  [facility] MD/PA/NP OP Progress Note  05/01/2021 2:29 PM Valerie Rose  MRN:  [id number]  Chief Complaint:  Chief Complaint   Anxiety; Depression; Follow-up    HPI: This patient is a 30 year old single white female who lives with her son in [place].  She is currently working for a [employer] in the office.   The patient returns after 3 months regarding anxiety depression and posttraumatic stress disorder  The patient states that she is doing okay.  Her paternal uncle died a couple of months ago of a brain tumor which is been difficult for the family particularly after her son's father died of a drug overdose in August 11, 2019.  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  This will allow her to have more time with her son.  She states that overall her mood is stable.  She has been a bit  tired lately but is hoping with the school year she and her son will get on a better schedule and get more integrated.  She is sleeping well.  She denies any thoughts of self-harm or suicidal ideation and her anxiety and panic attacks are under good control.   Visit Diagnosis:    ICD-10-CM   1. PTSD (post-traumatic stress disorder)  F43.10       Past Psychiatric History: none  Past Medical History:  Past Medical History:  Diagnosis Date   Anxiety    Chronic daily headache    Depression    Ovarian cyst     Past Surgical History:  Procedure Laterality Date   CESAREAN SECTION     I & D EXTREMITY Left 03/04/2014   Procedure: IRRIGATION AND DEBRIDEMENT EXTREMITY, REPAIR OF MULTIPLE LACERATIONS, HIP AND KNEE;  Surgeon: Cammy Copa, MD;  [facility];  Service: Orthopedics;  Laterality: Left;  Wounds located at left hip and knee.   I & D EXTREMITY Left 03/06/2014   Procedure: IRRIGATION AND DEBRIDEMENT EXTREMITY WITH ABX BEAD PLACEMENT WITH DELAYED PRIMARY CLOSURE.;  Surgeon: Cammy Copa, MD;  Location: [facility];  Service: Orthopedics;  Laterality: Left;   LEG SURGERY      Family Psychiatric History: see below  Family History:  Family History  Problem Relation Age of Onset   Depression Mother    Anxiety disorder Mother    Post-traumatic stress disorder Sister    Alcohol abuse  Maternal Grandmother    Depression Maternal Grandmother    Sudden Cardiac Death Neg Hx     Social History:  Social History   Socioeconomic History   Marital status: Single    Spouse name: Not on file   Number of children: Not on file   Years of education: Not on file   Highest education level: Not on file  Occupational History   Not on file  Tobacco Use   Smoking status: Former   Smokeless tobacco: Never  Vaping Use   Vaping Use: Never used  Substance and Sexual Activity   Alcohol use: No    Comment: no alcohol since admit 06/21/16   Drug use: No   Sexual activity: Not Currently     Birth control/protection: I.U.D.  Other Topics Concern   Not on file  Social History Narrative   Not on file   Social Determinants of Health   Financial Resource Strain: Not on file  Food Insecurity: Not on file  Transportation Needs: Not on file  Physical Activity: Not on file  Stress: Not on file  Social Connections: Not on file    Allergies: No Known Allergies  Metabolic Disorder Labs: No results found for: HGBA1C, MPG No results found for: PROLACTIN No results found for: CHOL, TRIG, HDL, CHOLHDL, VLDL, LDLCALC Lab Results  Component Value Date   TSH 0.305 (L) 06/22/2016    Therapeutic Level Labs: No results found for: LITHIUM No results found for: VALPROATE No components found for:  CBMZ  Current Medications: Current Outpatient Medications  Medication Sig Dispense Refill   acetaminophen (TYLENOL) 500 MG tablet Take 500 mg by mouth every 6 (six) hours as needed for mild pain or moderate pain.     ALPRAZolam (XANAX) 1 MG tablet Take 1 tablet (1 mg total) by mouth 4 (four) times daily. 120 tablet 2   buPROPion (WELLBUTRIN XL) 300 MG 24 hr tablet Take 1 tablet (300 mg total) by mouth every morning. 30 tablet 2   ibuprofen (ADVIL,MOTRIN) 600 MG tablet Take 1 tablet (600 mg total) by mouth every 6 (six) hours as needed. 30 tablet 0   mirtazapine (REMERON) 45 MG tablet Take 1 tablet (45 mg total) by mouth at bedtime. 30 tablet 2   ondansetron (ZOFRAN-ODT) 4 MG disintegrating tablet Take 1 tablet (4 mg total) by mouth every 8 (eight) hours as needed for nausea or vomiting. 8 tablet 0   traMADol (ULTRAM) 50 MG tablet Take 1 tablet (50 mg total) by mouth every 6 (six) hours as needed. 12 tablet 0   No current facility-administered medications for this visit.     Musculoskeletal: Strength & Muscle Tone: within normal limits Gait & Station: normal Patient leans: N/A  Psychiatric Specialty Exam: Review of Systems  All other systems reviewed and are negative.  There  were no vitals taken for this visit.There is no height or weight on file to calculate BMI.  General Appearance: NA  Eye Contact:  NA  Speech:  Clear and Coherent  Volume:  Normal  Mood:  Euthymic  Affect:  NA  Thought Process:  Goal Directed  Orientation:  Full (Time, Place, and Person)  Thought Content: WDL   Suicidal Thoughts:  No  Homicidal Thoughts:  No  Memory:  Immediate;   Good Recent;   Good Remote;   Good  Judgement:  Good  Insight:  Good  Psychomotor Activity:  Normal  Concentration:  Concentration: Good and Attention Span: Good  Recall:  Good  Fund of Knowledge: Good  Language: Good  Akathisia:  No  Handed:  Right  AIMS (if indicated): not done  Assets:  Communication Skills Desire for Improvement Physical Health Resilience Social Support Talents/Skills  ADL's:  Intact  Cognition: WNL  Sleep:  Good   Screenings: PHQ2-9    Flowsheet Row [facility] Video Visit from 12/17/2020 in [facility]  PHQ-2 Total Score 1 1      Flowsheet Row Video Visit from 05/01/2021 in [facility] Video Visit from 12/17/2020 in [facility]  C-SSRS RISK CATEGORY No Risk No Risk        Assessment and Plan: This patient is a 30 year old female with a history of posttraumatic stress disorder depression anxiety and insomnia.  She is doing well on her current regimen.  She will continue mirtazapine 45 mg at bedtime for sleep and anxiety, Wellbutrin XL 300 mg daily for depression and Xanax 1 mg 4 times daily for anxiety.  She will return to see me in 3 months   [name], MD 05/01/2021, 2:29 PM

## 2021-07-22 ENCOUNTER — Other Ambulatory Visit (HOSPITAL_COMMUNITY): Payer: Self-pay | Admitting: Psychiatry

## 2021-07-30 ENCOUNTER — Telehealth (HOSPITAL_COMMUNITY): Payer: Self-pay | Admitting: *Deleted

## 2021-07-30 ENCOUNTER — Other Ambulatory Visit (HOSPITAL_COMMUNITY): Payer: Self-pay | Admitting: Psychiatry

## 2021-07-30 ENCOUNTER — Telehealth (HOSPITAL_COMMUNITY): Payer: Self-pay | Admitting: Psychiatry

## 2021-07-30 MED ORDER — BUPROPION HCL ER (XL) 300 MG PO TB24: 300.0000 mg | ORAL_TABLET | ORAL | 2 refills | Status: DC

## 2021-07-30 MED ORDER — MIRTAZAPINE 45 MG PO TABS: 45.0000 mg | ORAL_TABLET | Freq: Every day | ORAL | 2 refills | Status: DC

## 2021-07-30 NOTE — Telephone Encounter (Signed)
Patient was scheduled for 07-30-2021 and appt was rescheduled due to provider being out of office.   Patient is needing refills before her f/u appt.   Medications needing refills for are Wellbutrin and Remeron.

## 2021-07-30 NOTE — Telephone Encounter (Signed)
sent 

## 2021-08-05 ENCOUNTER — Encounter (HOSPITAL_COMMUNITY): Payer: Self-pay | Admitting: Psychiatry

## 2021-08-05 ENCOUNTER — Other Ambulatory Visit: Payer: Self-pay

## 2021-08-05 ENCOUNTER — Telehealth (INDEPENDENT_AMBULATORY_CARE_PROVIDER_SITE_OTHER): Payer: Self-pay | Admitting: Psychiatry

## 2021-08-05 DIAGNOSIS — F4310 Post-traumatic stress disorder, unspecified: Secondary | ICD-10-CM

## 2021-08-05 MED ORDER — ALPRAZOLAM 1 MG PO TABS: 1.0000 mg | ORAL_TABLET | Freq: Four times a day (QID) | ORAL | 2 refills | Status: DC

## 2021-08-05 MED ORDER — ZOLPIDEM TARTRATE 10 MG PO TABS: 10.0000 mg | ORAL_TABLET | Freq: Every evening | ORAL | 2 refills | Status: DC | PRN

## 2021-08-05 MED ORDER — MIRTAZAPINE 45 MG PO TABS: 45.0000 mg | ORAL_TABLET | Freq: Every day | ORAL | 2 refills | Status: DC

## 2021-08-05 MED ORDER — BUPROPION HCL ER (XL) 300 MG PO TB24: 300.0000 mg | ORAL_TABLET | ORAL | 2 refills | Status: DC

## 2021-08-05 NOTE — Progress Notes (Signed)
Virtual Visit via Telephone Note  I connected with Valerie Rose on 08/05/21 at  8:40 AM EST by telephone and verified that I am speaking with the correct person using two identifiers.  Location: Patient: home Provider: office   I discussed the limitations, risks, security and privacy concerns of performing an evaluation and management service by telephone and the availability of in person appointments. I also discussed with the patient that there may be a patient responsible charge related to this service. The patient expressed understanding and agreed to proceed.      I discussed the assessment and treatment plan with the patient. The patient was provided an opportunity to ask questions and all were answered. The patient agreed with the plan and demonstrated an understanding of the instructions.   The patient was advised to call back or seek an in-person evaluation if the symptoms worsen or if the condition fails to improve as anticipated.  I provided 15 minutes of non-face-to-face time during this encounter.   [name], MD  [facility] MD/PA/NP OP Progress Note  08/05/2021 9:04 AM Valerie Rose  MRN:  [id number]  Chief Complaint:  Chief Complaint   Anxiety; Depression; Follow-up    HPI: This patient is a 30 year old single white female who lives with her son in [place].  She is currently working for a [employer] in the office.   The patient returns after 3 months regarding anxiety depression and posttraumatic stress disorder  The patient states that she and her family are under a great deal of stress.  Her 21 year old brother shot and killed himself last Wednesday.  They had the burial and memorial service yesterday.  She stated that he had been acting strangely for a while and had gone to various emergency rooms claiming that he did not feel well but did not relate to anyone that he was having depression.  She is feeling quite overwhelmed as is her mother.  She is gotten  some time off work but will need to return at the end of the week.  She is trying to take care of herself and the medication is helping with her nerves.  She is not sleeping well and asked if we can restart the Ambien and I think this is reasonable. Visit Diagnosis:    ICD-10-CM   1. PTSD (post-traumatic stress disorder)  F43.10       Past Psychiatric History: none  Past Medical History:  Past Medical History:  Diagnosis Date   Anxiety    Chronic daily headache    Depression    Ovarian cyst     Past Surgical History:  Procedure Laterality Date   CESAREAN SECTION     I & D EXTREMITY Left 03/04/2014   Procedure: IRRIGATION AND DEBRIDEMENT EXTREMITY, REPAIR OF MULTIPLE LACERATIONS, HIP AND KNEE;  Surgeon: Cammy Copa, MD;  [facility];  Service: Orthopedics;  Laterality: Left;  Wounds located at left hip and knee.   I & D EXTREMITY Left 03/06/2014   Procedure: IRRIGATION AND DEBRIDEMENT EXTREMITY WITH ABX BEAD PLACEMENT WITH DELAYED PRIMARY CLOSURE.;  Surgeon: Cammy Copa, MD;  [facility]: [facility];  Service: Orthopedics;  Laterality: Left;   LEG SURGERY      Family Psychiatric History: see below  Family History:  Family History  Problem Relation Age of Onset   Depression Mother    Anxiety disorder Mother    Post-traumatic stress disorder Sister    Alcohol abuse Maternal Grandmother    Depression  Maternal Grandmother    Sudden Cardiac Death Neg Hx     Social History:  Social History   Socioeconomic History   Marital status: Single    Spouse name: Not on file   Number of children: Not on file   Years of education: Not on file   Highest education level: Not on file  Occupational History   Not on file  Tobacco Use   Smoking status: Former   Smokeless tobacco: Never  Vaping Use   Vaping Use: Never used  Substance and Sexual Activity   Alcohol use: No    Comment: no alcohol since admit 06/21/16   Drug use: No   Sexual activity: Not Currently    Birth  control/protection: I.U.D.  Other Topics Concern   Not on file  Social History Narrative   Not on file   Social Determinants of Health   Financial Resource Strain: Not on file  Food Insecurity: Not on file  Transportation Needs: Not on file  Physical Activity: Not on file  Stress: Not on file  Social Connections: Not on file    Allergies: No Known Allergies  Metabolic Disorder Labs: No results found for: HGBA1C, MPG No results found for: PROLACTIN No results found for: CHOL, TRIG, HDL, CHOLHDL, VLDL, LDLCALC Lab Results  Component Value Date   TSH 0.305 (L) 06/22/2016    Therapeutic Level Labs: No results found for: LITHIUM No results found for: VALPROATE No components found for:  CBMZ  Current Medications: Current Outpatient Medications  Medication Sig Dispense Refill   acetaminophen (TYLENOL) 500 MG tablet Take 500 mg by mouth every 6 (six) hours as needed for mild pain or moderate pain.     ALPRAZolam (XANAX) 1 MG tablet Take 1 tablet (1 mg total) by mouth 4 (four) times daily. 120 tablet 2   buPROPion (WELLBUTRIN XL) 300 MG 24 hr tablet Take 1 tablet (300 mg total) by mouth every morning. 30 tablet 2   ibuprofen (ADVIL,MOTRIN) 600 MG tablet Take 1 tablet (600 mg total) by mouth every 6 (six) hours as needed. 30 tablet 0   mirtazapine (REMERON) 45 MG tablet Take 1 tablet (45 mg total) by mouth at bedtime. 30 tablet 2   ondansetron (ZOFRAN-ODT) 4 MG disintegrating tablet Take 1 tablet (4 mg total) by mouth every 8 (eight) hours as needed for nausea or vomiting. 8 tablet 0   traMADol (ULTRAM) 50 MG tablet Take 1 tablet (50 mg total) by mouth every 6 (six) hours as needed. 12 tablet 0   zolpidem (AMBIEN) 10 MG tablet Take 1 tablet (10 mg total) by mouth at bedtime as needed for sleep. 30 tablet 2   No current facility-administered medications for this visit.     Musculoskeletal: Strength & Muscle Tone: na Gait & Station: na Patient leans: N/A  Psychiatric  Specialty Exam: Review of Systems  Psychiatric/Behavioral:  Positive for dysphoric mood and sleep disturbance.   All other systems reviewed and are negative.  There were no vitals taken for this visit.There is no height or weight on file to calculate BMI.  General Appearance: NA  Eye Contact:  NA  Speech:  Clear and Coherent  Volume:  Decreased  Mood:  Angry and Dysphoric  Affect:  NA  Thought Process:  Goal Directed  Orientation:  Full (Time, Place, and Person)  Thought Content: Rumination   Suicidal Thoughts:  No  Homicidal Thoughts:  No  Memory:  Immediate;   Good Recent;   Good Remote;  Good  Judgement:  Good  Insight:  Good  Psychomotor Activity:  Decreased  Concentration:  Concentration: Fair and Attention Span: Fair  Recall:  Good  Fund of Knowledge: Good  Language: Good  Akathisia:  No  Handed:  Right  AIMS (if indicated): not done  Assets:  Communication Skills Desire for Improvement Physical Health Resilience Social Support Talents/Skills  ADL's:  Intact  Cognition: WNL  Sleep:  Poor   Screenings: PHQ2-9    Flowsheet Row Video Visit from 05/01/2021 [facility] Video Visit from 12/17/2020 in [facility]  PHQ-2 Total Score 1 1      Flowsheet Row Video Visit from 05/01/2021 in [facility] Video Visit from 12/17/2020 in [facility]  C-SSRS RISK CATEGORY No Risk No Risk        Assessment and Plan: This patient is a 30 year old female with a history of posttraumatic stress disorder depression anxiety and insomnia.  Given the recent stressor of her brother's suicide she will need something to help with sleep.  We will restart Ambien 10 mg at bedtime.  She will continue mirtazapine 45 mg at bedtime for sleep and anxiety, Wellbutrin XL 300 mg daily for depression and Xanax 1 mg 4 times daily for  anxiety.  I urged her to get into counseling and she is going to try Facilities manager.  She will return to see me in 4 weeks or call sooner as needed   [name], MD 08/05/2021, 9:04 AM

## 2021-09-02 ENCOUNTER — Other Ambulatory Visit: Payer: Self-pay

## 2021-09-02 ENCOUNTER — Telehealth (HOSPITAL_COMMUNITY): Payer: BC Managed Care – PPO | Admitting: Psychiatry

## 2021-09-16 ENCOUNTER — Other Ambulatory Visit: Payer: Self-pay

## 2021-09-16 ENCOUNTER — Telehealth (HOSPITAL_COMMUNITY): Payer: BC Managed Care – PPO | Admitting: Psychiatry

## 2021-09-22 NOTE — Telephone Encounter (Signed)
No Show for Appointment

## 2021-09-26 ENCOUNTER — Telehealth (INDEPENDENT_AMBULATORY_CARE_PROVIDER_SITE_OTHER): Payer: BC Managed Care – PPO | Admitting: Psychiatry

## 2021-09-26 ENCOUNTER — Other Ambulatory Visit: Payer: Self-pay

## 2021-09-26 ENCOUNTER — Encounter (HOSPITAL_COMMUNITY): Payer: Self-pay | Admitting: Psychiatry

## 2021-09-26 DIAGNOSIS — F4310 Post-traumatic stress disorder, unspecified: Secondary | ICD-10-CM | POA: Diagnosis not present

## 2021-09-26 MED ORDER — ALPRAZOLAM 1 MG PO TABS: 1.0000 mg | ORAL_TABLET | Freq: Four times a day (QID) | ORAL | 2 refills | Status: DC

## 2021-09-26 MED ORDER — BUPROPION HCL ER (XL) 300 MG PO TB24: 300.0000 mg | ORAL_TABLET | ORAL | 2 refills | Status: DC

## 2021-09-26 MED ORDER — MIRTAZAPINE 45 MG PO TABS: 45.0000 mg | ORAL_TABLET | Freq: Every day | ORAL | 2 refills | Status: DC

## 2021-09-26 MED ORDER — ZOLPIDEM TARTRATE 10 MG PO TABS: 10.0000 mg | ORAL_TABLET | Freq: Every evening | ORAL | 2 refills | Status: DC | PRN

## 2021-09-26 NOTE — Progress Notes (Signed)
Virtual Visit via Telephone Note  I connected with Valerie Rose on 09/26/21 at  9:20 AM EST by telephone and verified that I am speaking with the correct person using two identifiers.  Location: Patient: home Provider: office   I discussed the limitations, risks, security and privacy concerns of performing an evaluation and management service by telephone and the availability of in person appointments. I also discussed with the patient that there may be a patient responsible charge related to this service. The patient expressed understanding and agreed to proceed.       I discussed the assessment and treatment plan with the patient. The patient was provided an opportunity to ask questions and all were answered. The patient agreed with the plan and demonstrated an understanding of the instructions.   The patient was advised to call back or seek an in-person evaluation if the symptoms worsen or if the condition fails to improve as anticipated.  I provided 13 minutes of non-face-to-face time during this encounter.   [name], MD  [facility] MD/PA/NP OP Progress Note  09/26/2021 9:32 AM Valerie Rose  MRN:  [id number]  Chief Complaint:  Chief Complaint   Anxiety; Depression; Follow-up    HPI: This patient is a 31 year old single white female who lives with her son in [place].  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  The patient returns for follow-up after 2 months regarding anxiety depression and posttraumatic stress disorder.  Last time she told me that her younger brother had shot and killed himself in mid November.  She states the holidays were difficult.  However the medications are helping her depression and anxiety and the Ambien is helping her sleep.  She is spending a lot of time with her mother.  She is trying to keep her side on a regular schedule when she is working herself.  She realizes it is going to be a long time since till things feel  normal again but she is trying to do it a dayat a time.  She denies any thoughts of self-harm or suicide Visit Diagnosis:    ICD-10-CM   1. PTSD (post-traumatic stress disorder)  F43.10       Past Psychiatric History: none  Past Medical History:  Past Medical History:  Diagnosis Date   Anxiety    Chronic daily headache    Depression    Ovarian cyst     Past Surgical History:  Procedure Laterality Date   CESAREAN SECTION     I & D EXTREMITY Left 03/04/2014   Procedure: IRRIGATION AND DEBRIDEMENT EXTREMITY, REPAIR OF MULTIPLE LACERATIONS, HIP AND KNEE;  Surgeon: Meredith Pel, MD;  Location: [place];  Service: Orthopedics;  Laterality: Left;  Wounds located at left hip and knee.   I & D EXTREMITY Left 03/06/2014   Procedure: IRRIGATION AND DEBRIDEMENT EXTREMITY WITH ABX BEAD PLACEMENT WITH DELAYED PRIMARY CLOSURE.;  Surgeon: Meredith Pel, MD;  Location: [place];  Service: Orthopedics;  Laterality: Left;   LEG SURGERY      Family Psychiatric History: see below  Family History:  Family History  Problem Relation Age of Onset   Depression Mother    Anxiety disorder Mother    Post-traumatic stress disorder Sister    Alcohol abuse Maternal Grandmother    Depression Maternal Grandmother    Sudden Cardiac Death Neg Hx     Social History:  Social History   Socioeconomic History   Marital status:  Single    Spouse name: Not on file   Number of children: Not on file   Years of education: Not on file   Highest education level: Not on file  Occupational History   Not on file  Tobacco Use   Smoking status: Former   Smokeless tobacco: Never  Vaping Use   Vaping Use: Never used  Substance and Sexual Activity   Alcohol use: No    Comment: no alcohol since admit 06/21/16   Drug use: No   Sexual activity: Not Currently    Birth control/protection: I.U.D.  Other Topics Concern   Not on file  Social History Narrative   Not on file   Social Determinants of Health    Financial Resource Strain: Not on file  Food Insecurity: Not on file  Transportation Needs: Not on file  Physical Activity: Not on file  Stress: Not on file  Social Connections: Not on file    Allergies: No Known Allergies  Metabolic Disorder Labs: No results found for: HGBA1C, MPG No results found for: PROLACTIN No results found for: CHOL, TRIG, HDL, CHOLHDL, VLDL, LDLCALC Lab Results  Component Value Date   TSH 0.305 (L) 06/22/2016    Therapeutic Level Labs: No results found for: LITHIUM No results found for: VALPROATE No components found for:  CBMZ  Current Medications: Current Outpatient Medications  Medication Sig Dispense Refill   acetaminophen (TYLENOL) 500 MG tablet Take 500 mg by mouth every 6 (six) hours as needed for mild pain or moderate pain.     ALPRAZolam (XANAX) 1 MG tablet Take 1 tablet (1 mg total) by mouth 4 (four) times daily. 120 tablet 2   buPROPion (WELLBUTRIN XL) 300 MG 24 hr tablet Take 1 tablet (300 mg total) by mouth every morning. 30 tablet 2   ibuprofen (ADVIL,MOTRIN) 600 MG tablet Take 1 tablet (600 mg total) by mouth every 6 (six) hours as needed. 30 tablet 0   mirtazapine (REMERON) 45 MG tablet Take 1 tablet (45 mg total) by mouth at bedtime. 30 tablet 2   ondansetron (ZOFRAN-ODT) 4 MG disintegrating tablet Take 1 tablet (4 mg total) by mouth every 8 (eight) hours as needed for nausea or vomiting. 8 tablet 0   traMADol (ULTRAM) 50 MG tablet Take 1 tablet (50 mg total) by mouth every 6 (six) hours as needed. 12 tablet 0   zolpidem (AMBIEN) 10 MG tablet Take 1 tablet (10 mg total) by mouth at bedtime as needed for sleep. 30 tablet 2   No current facility-administered medications for this visit.     Musculoskeletal: Strength & Muscle Tone: na Gait & Station: na Patient leans: N/A  Psychiatric Specialty Exam: Review of Systems  All other systems reviewed and are negative.  There were no vitals taken for this visit.There is no height or  weight on file to calculate BMI.  General Appearance: NA  Eye Contact:  NA  Speech:  Clear and Coherent  Volume:  Normal  Mood:  Dysphoric  Affect:  NA  Thought Process:  Goal Directed  Orientation:  Full (Time, Place, and Person)  Thought Content: Rumination   Suicidal Thoughts:  No  Homicidal Thoughts:  No  Memory:  Immediate;   Good Recent;   Good Remote;   Good  Judgement:  Good  Insight:  Good  Psychomotor Activity:  Normal  Concentration:  Concentration: Good and Attention Span: Good  Recall:  Good  Fund of Knowledge: Good  Language: Good  Akathisia:  No  Handed:  Right  AIMS (if indicated): not done  Assets:  Communication Skills Desire for Improvement Physical Health Resilience Social Support Talents/Skills  ADL's:  Intact  Cognition: WNL  Sleep:  Good   Screenings: PHQ2-9    Flowsheet Row Video Visit from 09/26/2021 in [place] Video Visit from 05/01/2021 in [place] Video Visit from 12/17/2020 in [facility]-  PHQ-2 Total Score 1 1 1       Flowsheet Row Video Visit from 05/01/2021 in [place] Video Visit from 12/17/2020 in [place] No Risk No Risk        Assessment and Plan: This patient is a 31 year old female with a history of posttraumatic stress disorder depression anxiety and insomnia.  She is sleeping better with the addition of Ambien 10 mg so this will be continued.  She will continue mirtazapine 45 mg at bedtime for sleep and anxiety, Wellbutrin XL 300 mg daily for depression and Xanax 1 mg 4 times daily for anxiety.  She will return to see me in 3 months or call sooner as needed   [name], MD 09/26/2021, 9:32 AM

## 2021-12-22 ENCOUNTER — Encounter (HOSPITAL_COMMUNITY): Payer: Self-pay | Admitting: Psychiatry

## 2021-12-22 ENCOUNTER — Telehealth (INDEPENDENT_AMBULATORY_CARE_PROVIDER_SITE_OTHER): Payer: BC Managed Care – PPO | Admitting: Psychiatry

## 2021-12-22 ENCOUNTER — Telehealth (HOSPITAL_COMMUNITY): Payer: Self-pay | Admitting: Psychiatry

## 2021-12-22 DIAGNOSIS — F4310 Post-traumatic stress disorder, unspecified: Secondary | ICD-10-CM | POA: Diagnosis not present

## 2021-12-22 MED ORDER — BUPROPION HCL ER (XL) 300 MG PO TB24: 300.0000 mg | ORAL_TABLET | ORAL | 2 refills | Status: DC

## 2021-12-22 MED ORDER — MIRTAZAPINE 45 MG PO TABS: 45.0000 mg | ORAL_TABLET | Freq: Every day | ORAL | 2 refills | Status: DC

## 2021-12-22 MED ORDER — ALPRAZOLAM 1 MG PO TABS: 1.0000 mg | ORAL_TABLET | Freq: Four times a day (QID) | ORAL | 2 refills | Status: DC

## 2021-12-22 MED ORDER — ZOLPIDEM TARTRATE 10 MG PO TABS: 10.0000 mg | ORAL_TABLET | Freq: Every evening | ORAL | 2 refills | Status: DC | PRN

## 2021-12-22 NOTE — Telephone Encounter (Signed)
Called to verify pts insurance coverage, pt advised started a new job and is waiting on insurance card to arrive through the mail ?

## 2021-12-22 NOTE — Progress Notes (Signed)
BH MD/PA/NP OP Progress Note ? ?12/22/2021 1:29 PM ?Karlyn AgeeKendra L Devall  ?MRN:  [id number] ? ?Chief Complaint: depression, anxiety ?HPI: Patient is a 31 year old single white female who [place].  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 ? ?The patient states that she is doing fairly well.  As noted in previous notes her brother committed suicide back in November.  She has also lost her son's father a couple of years ago to a drug overdose.  Despite all this she seems to be doing fairly well.  She states that the medications are helping and she denies significant depression or anxiety.  She recently got a job at [employer] and she is enjoying it and feels comfortable there.  She is sleeping fairly well with the medications.  She denies serious depression or thoughts of self-harm or suicide.  She still has occasional panic attacks but the medications are helping.  She denies any thoughts of self-harm or suicide.  She is trying to stay busy and active ?Visit Diagnosis:  ?  ICD-10-CM   ?1. PTSD (post-traumatic stress disorder)  F43.10   ?  ? ? ?Past Psychiatric History: none ? ?Past Medical History:  ?Past Medical History:  ?Diagnosis Date  ? Anxiety   ? Chronic daily headache   ? Depression   ? Ovarian cyst   ?  ?Past Surgical History:  ?Procedure Laterality Date  ? CESAREAN SECTION    ? I & D EXTREMITY Left 03/04/2014  ? Procedure: IRRIGATION AND DEBRIDEMENT EXTREMITY, REPAIR OF MULTIPLE LACERATIONS, HIP AND KNEE;  Surgeon: Cammy CopaGregory Scott Dean, MD;  Location: [facility] OR;  Service: Orthopedics;  Laterality: Left;  Wounds located at left hip and knee.  ? I & D EXTREMITY Left 03/06/2014  ? Procedure: IRRIGATION AND DEBRIDEMENT EXTREMITY WITH ABX BEAD PLACEMENT WITH DELAYED PRIMARY CLOSURE.;  Surgeon: Cammy CopaGregory Scott Dean, MD;  [facility];  Service: Orthopedics;  Laterality: Left;  ? LEG SURGERY    ? ? ?Family Psychiatric History: see  below ? ?Family History:  ?Family History  ?Problem Relation Age of Onset  ? Depression Mother   ? Anxiety disorder Mother   ? Post-traumatic stress disorder Sister   ? Alcohol abuse Maternal Grandmother   ? Depression Maternal Grandmother   ? Sudden Cardiac Death Neg Hx   ? ? ?Social History:  ?Social History  ? ?Socioeconomic History  ? Marital status: Single  ?  Spouse name: Not on file  ? Number of children: Not on file  ? Years of education: Not on file  ? Highest education level: Not on file  ?Occupational History  ? Not on file  ?Tobacco Use  ? Smoking status: Former  ? Smokeless tobacco: Never  ?Vaping Use  ? Vaping Use: Never used  ?Substance and Sexual Activity  ? Alcohol use: No  ?  Comment: no alcohol since admit 06/21/16  ? Drug use: No  ? Sexual activity: Not Currently  ?  Birth control/protection: I.U.D.  ?Other Topics Concern  ? Not on file  ?Social History Narrative  ? Not on file  ? ?Social Determinants of Health  ? ?Financial Resource Strain: Not on file  ?Food Insecurity: Not on file  ?Transportation Needs: Not on file  ?Physical Activity: Not on file  ?Stress: Not on file  ?Social Connections: Not on file  ? ? ?Allergies: No Known Allergies ? ?Metabolic Disorder Labs: ?No results found for: HGBA1C,  MPG ?No results found for: PROLACTIN ?No results found for: CHOL, TRIG, HDL, CHOLHDL, VLDL, LDLCALC ?Lab Results  ?Component Value Date  ? TSH 0.305 (L) 06/22/2016  ? ? ?Therapeutic Level Labs: ?No results found for: LITHIUM ?No results found for: VALPROATE ?No components found for:  CBMZ ? ?Current Medications: ?Current Outpatient Medications  ?Medication Sig Dispense Refill  ? acetaminophen (TYLENOL) 500 MG tablet Take 500 mg by mouth every 6 (six) hours as needed for mild pain or moderate pain.    ? ALPRAZolam (XANAX) 1 MG tablet Take 1 tablet (1 mg total) by mouth 4 (four) times daily. 120 tablet 2  ? buPROPion (WELLBUTRIN XL) 300 MG 24 hr tablet Take 1 tablet (300 mg total) by mouth every morning.  30 tablet 2  ? ibuprofen (ADVIL,MOTRIN) 600 MG tablet Take 1 tablet (600 mg total) by mouth every 6 (six) hours as needed. 30 tablet 0  ? mirtazapine (REMERON) 45 MG tablet Take 1 tablet (45 mg total) by mouth at bedtime. 30 tablet 2  ? ondansetron (ZOFRAN-ODT) 4 MG disintegrating tablet Take 1 tablet (4 mg total) by mouth every 8 (eight) hours as needed for nausea or vomiting. 8 tablet 0  ? zolpidem (AMBIEN) 10 MG tablet Take 1 tablet (10 mg total) by mouth at bedtime as needed for sleep. 30 tablet 2  ? ?No current facility-administered medications for this visit.  ? ? ? ?Musculoskeletal: ?Strength & Muscle Tone: na ?Gait & Station: na ?Patient leans: N/A ? ?Psychiatric Specialty Exam: ?Review of Systems  ?All other systems reviewed and are negative.  ?There were no vitals taken for this visit.There is no height or weight on file to calculate BMI.  ?General Appearance: NA  ?Eye Contact:  NA  ?Speech:  Clear and Coherent  ?Volume:  Normal  ?Mood:  Euthymic  ?Affect:  NA  ?Thought Process:  Goal Directed  ?Orientation:  Full (Time, Place, and Person)  ?Thought Content: WDL   ?Suicidal Thoughts:  No  ?Homicidal Thoughts:  No  ?Memory:  Immediate;   Good ?Recent;   Good ?Remote;   Good  ?Judgement:  Good  ?Insight:  Fair  ?Psychomotor Activity:  Normal  ?Concentration:  Concentration: Good and Attention Span: Good  ?Recall:  Good  ?Fund of Knowledge: Good  ?Language: Good  ?Akathisia:  No  ?Handed:  Right  ?AIMS (if indicated): not done  ?Assets:  Communication Skills ?Desire for Improvement ?Physical Health ?Resilience ?Social Support ?Talents/Skills  ?ADL's:  Intact  ?Cognition: WNL  ?Sleep:  Good  ? ?Screenings: ?PHQ2-9   ? ?Flowsheet Row [facility] Video Visit from 09/26/2021 in [facility] Video Visit from 05/01/2021 in [facility] Video Visit from 12/17/2020  in [facility]  ?PHQ-2 Total Score 0 1 1 1   ? ?  ? ?Flowsheet Row Video Visit from 12/22/2021 in [facility] Video Visit from 05/01/2021 in [facility] Video Visit from 12/17/2020 [facility]  ?C-SSRS RISK CATEGORY No Risk No Risk No Risk  ? ?  ? ? ? ?Assessment and Plan: This patient is a 31 year old female with a history of posttraumatic stress disorder depression anxiety and insomnia.  She has had significant losses in her life.  Nevertheless she is doing well on her current regimen.  She will continue Ambien 10 mg at bedtime for sleep, mirtazapine 45 mg at  bedtime for sleep and anxiety, Wellbutrin 300 mg daily for depression and Xanax 1 mg 4 times daily for anxiety.  She will return to see me in 3 months ? ?Collaboration of Care: Collaboration of Care: Primary Care Provider AEB chart notes will be released to PCP at patient's request ? ?Patient/Guardian was advised Release of Information must be obtained prior to any record release in order to collaborate their care with an outside provider. Patient/Guardian was advised if they have not already done so to contact the registration department to sign all necessary forms in order for [place] to release information regarding their care.  ? ?Consent: Patient/Guardian gives verbal consent for treatment and assignment of benefits for services provided during this visit. Patient/Guardian expressed understanding and agreed to proceed.  ? ? ?[name], MD ?12/22/2021, 1:29 PM ? ?

## 2021-12-22 NOTE — Telephone Encounter (Signed)
Please see previous note.

## 2021-12-23 NOTE — Progress Notes (Signed)
Virtual Visit via Telephone Note ? ?I connected with Valerie Rose on 12/23/21 at  1:20 PM EDT by telephone and verified that I am speaking with the correct person using two identifiers. ? ?Location: ?Patient: home ?Provider: home office ?  ?I discussed the limitations, risks, security and privacy concerns of performing an evaluation and management service by telephone and the availability of in person appointments. I also discussed with the patient that there may be a patient responsible charge related to this service. The patient expressed understanding and agreed to proceed. ? ? ?  ?I discussed the assessment and treatment plan with the patient. The patient was provided an opportunity to ask questions and all were answered. The patient agreed with the plan and demonstrated an understanding of the instructions. ?  ?The patient was advised to call back or seek an in-person evaluation if the symptoms worsen or if the condition fails to improve as anticipated. ? ?I provided 14 minutes of non-face-to-face time during this encounter. ? ? ?[name], MD ? ?BH MD/PA/NP OP Progress Note ? ?12/23/2021 1:25 PM ?Valerie Rose  ?MRN:  [id number] ? ?Chief Complaint: depression, anxiety ?HPI: Patient is a 31 year old single white female who lives with her son in [place].  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 ? ?The patient states that she is doing fairly well.  As noted in previous notes her brother committed suicide back in November.  She has also lost her son's father a couple of years ago to a drug overdose.  Despite all this she seems to be doing fairly well.  She states that the medications are helping and she denies significant depression or anxiety.  She recently got a job at [employer] and she is enjoying it and feels comfortable there.  She is sleeping fairly well with the medications.  She denies serious depression or thoughts of self-harm or  suicide.  She still has occasional panic attacks but the medications are helping.  She denies any thoughts of self-harm or suicide.  She is trying to stay busy and active ?Visit Diagnosis:  ?  ICD-10-CM   ?1. PTSD (post-traumatic stress disorder)  F43.10   ?  ? ? ?Past Psychiatric History: none ? ?Past Medical History:  ?Past Medical History:  ?Diagnosis Date  ? Anxiety   ? Chronic daily headache   ? Depression   ? Ovarian cyst   ?  ?Past Surgical History:  ?Procedure Laterality Date  ? CESAREAN SECTION    ? I & D EXTREMITY Left 03/04/2014  ? Procedure: IRRIGATION AND DEBRIDEMENT EXTREMITY, REPAIR OF MULTIPLE LACERATIONS, HIP AND KNEE;  Surgeon: Cammy Copa, MD;  Location: [facility] OR;  Service: Orthopedics;  Laterality: Left;  Wounds located at left hip and knee.  ? I & D EXTREMITY Left 03/06/2014  ? Procedure: IRRIGATION AND DEBRIDEMENT EXTREMITY WITH ABX BEAD PLACEMENT WITH DELAYED PRIMARY CLOSURE.;  Surgeon: Cammy Copa, MD;  Location: [facility] OR;  Service: Orthopedics;  Laterality: Left;  ? LEG SURGERY    ? ? ?Family Psychiatric History: see below ? ?Family History:  ?Family History  ?Problem Relation Age of Onset  ? Depression Mother   ? Anxiety disorder Mother   ? Post-traumatic stress disorder Sister   ? Alcohol abuse Maternal Grandmother   ? Depression Maternal Grandmother   ? Sudden Cardiac Death Neg Hx   ? ? ?Social History:  ?Social History  ? ?Socioeconomic History  ?  Marital status: Single  ?  Spouse name: Not on file  ? Number of children: Not on file  ? Years of education: Not on file  ? Highest education level: Not on file  ?Occupational History  ? Not on file  ?Tobacco Use  ? Smoking status: Former  ? Smokeless tobacco: Never  ?Vaping Use  ? Vaping Use: Never used  ?Substance and Sexual Activity  ? Alcohol use: No  ?  Comment: no alcohol since admit 06/21/16  ? Drug use: No  ? Sexual activity: Not Currently  ?  Birth control/protection: I.U.D.  ?Other Topics Concern  ? Not on file  ?Social History  Narrative  ? Not on file  ? ?Social Determinants of Health  ? ?Financial Resource Strain: Not on file  ?Food Insecurity: Not on file  ?Transportation Needs: Not on file  ?Physical Activity: Not on file  ?Stress: Not on file  ?Social Connections: Not on file  ? ? ?Allergies: No Known Allergies ? ?Metabolic Disorder Labs: ?No results found for: HGBA1C, MPG ?No results found for: PROLACTIN ?No results found for: CHOL, TRIG, HDL, CHOLHDL, VLDL, LDLCALC ?Lab Results  ?Component Value Date  ? TSH 0.305 (L) 06/22/2016  ? ? ?Therapeutic Level Labs: ?No results found for: LITHIUM ?No results found for: VALPROATE ?No components found for:  CBMZ ? ?Current Medications: ?Current Outpatient Medications  ?Medication Sig Dispense Refill  ? acetaminophen (TYLENOL) 500 MG tablet Take 500 mg by mouth every 6 (six) hours as needed for mild pain or moderate pain.    ? ALPRAZolam (XANAX) 1 MG tablet Take 1 tablet (1 mg total) by mouth 4 (four) times daily. 120 tablet 2  ? buPROPion (WELLBUTRIN XL) 300 MG 24 hr tablet Take 1 tablet (300 mg total) by mouth every morning. 30 tablet 2  ? ibuprofen (ADVIL,MOTRIN) 600 MG tablet Take 1 tablet (600 mg total) by mouth every 6 (six) hours as needed. 30 tablet 0  ? mirtazapine (REMERON) 45 MG tablet Take 1 tablet (45 mg total) by mouth at bedtime. 30 tablet 2  ? ondansetron (ZOFRAN-ODT) 4 MG disintegrating tablet Take 1 tablet (4 mg total) by mouth every 8 (eight) hours as needed for nausea or vomiting. 8 tablet 0  ? zolpidem (AMBIEN) 10 MG tablet Take 1 tablet (10 mg total) by mouth at bedtime as needed for sleep. 30 tablet 2  ? ?No current facility-administered medications for this visit.  ? ? ? ?Musculoskeletal: ?Strength & Muscle Tone: na ?Gait & Station: na ?Patient leans: N/A ? ?Psychiatric Specialty Exam: ?Review of Systems  ?All other systems reviewed and are negative.  ?There were no vitals taken for this visit.There is no height or weight on file to calculate BMI.  ?General Appearance:  NA  ?Eye Contact:  NA  ?Speech:  Clear and Coherent  ?Volume:  Normal  ?Mood:  Euthymic  ?Affect:  NA  ?Thought Process:  Goal Directed  ?Orientation:  Full (Time, Place, and Person)  ?Thought Content: WDL   ?Suicidal Thoughts:  No  ?Homicidal Thoughts:  No  ?Memory:  Immediate;   Good ?Recent;   Good ?Remote;   Good  ?Judgement:  Good  ?Insight:  Fair  ?Psychomotor Activity:  Normal  ?Concentration:  Concentration: Good and Attention Span: Good  ?Recall:  Good  ?Fund of Knowledge: Good  ?Language: Good  ?Akathisia:  No  ?Handed:  Right  ?AIMS (if indicated): not done  ?Assets:  Communication Skills ?Desire for Improvement ?Physical Health ?Resilience ?Social  Support ?Talents/Skills  ?ADL's:  Intact  ?Cognition: WNL  ?Sleep:  Good  ? ?Screenings: ?PHQ2-9   ? ?Flowsheet Row Video [facility] 12/22/2021 [facility] Video Visit from 09/26/2021 in [facility] Video Visit from 05/01/2021 in [facility] Video Visit from 12/17/2020 in [facility]  ?PHQ-2 Total Score 0 1 1 1   ? ?  ? ?Flowsheet Row Video Visit from 12/22/2021 in [facility] Video Visit from 05/01/2021 in [facility] Video Visit from 12[facility]17/2020 [facility]  ?C-SSRS RISK CATEGORY No Risk No Risk No Risk  ? ?  ? ? ? ?Assessment and Plan: This patient is a 31 year old female with a history of posttraumatic stress disorder depression anxiety and insomnia.  She has had significant losses in her life.  Nevertheless she is doing well on her current regimen.  She will continue Ambien 10 mg at bedtime for sleep, mirtazapine 45 mg at bedtime for sleep and anxiety, Wellbutrin 300 mg daily for depression and Xanax 1 mg 4 times daily for anxiety.  She will return to see me in 3  months ? ?Collaboration of Care: Collaboration of Care: Primary Care Provider AEB chart notes will be released to PCP at patient's request ? ?Patient/Guardian was advised Release of Information must be obtain

## 2022-02-23 ENCOUNTER — Other Ambulatory Visit (HOSPITAL_COMMUNITY): Payer: Self-pay | Admitting: Psychiatry

## 2022-02-24 ENCOUNTER — Encounter (HOSPITAL_COMMUNITY): Payer: Self-pay | Admitting: Psychiatry

## 2022-02-24 ENCOUNTER — Telehealth (INDEPENDENT_AMBULATORY_CARE_PROVIDER_SITE_OTHER): Payer: BC Managed Care – PPO | Admitting: Psychiatry

## 2022-02-24 DIAGNOSIS — F4310 Post-traumatic stress disorder, unspecified: Secondary | ICD-10-CM

## 2022-02-24 MED ORDER — ZOLPIDEM TARTRATE 10 MG PO TABS: 10.0000 mg | ORAL_TABLET | Freq: Every evening | ORAL | 2 refills | Status: DC | PRN

## 2022-02-24 MED ORDER — BUPROPION HCL ER (XL) 300 MG PO TB24: 300.0000 mg | ORAL_TABLET | ORAL | 2 refills | Status: DC

## 2022-02-24 MED ORDER — MIRTAZAPINE 45 MG PO TABS: 45.0000 mg | ORAL_TABLET | Freq: Every day | ORAL | 2 refills | Status: DC

## 2022-02-24 MED ORDER — ALPRAZOLAM 1 MG PO TABS: 1.0000 mg | ORAL_TABLET | Freq: Four times a day (QID) | ORAL | 2 refills | Status: DC

## 2022-02-24 NOTE — Progress Notes (Signed)
Virtual Visit via Telephone Note  I connected with Ivar Bury on 02/24/22 at  1:20 PM EDT by telephone and verified that I am speaking with the correct person using two identifiers.  Location: Patient: home Provider: office   I discussed the limitations, risks, security and privacy concerns of performing an evaluation and management service by telephone and the availability of in person appointments. I also discussed with the patient that there may be a patient responsible charge related to this service. The patient expressed understanding and agreed to proceed.      I discussed the assessment and treatment plan with the patient. The patient was provided an opportunity to ask questions and all were answered. The patient agreed with the plan and demonstrated an understanding of the instructions.   The patient was advised to call back or seek an in-person evaluation if the symptoms worsen or if the condition fails to improve as anticipated.  I provided 15 minutes of non-face-to-face time during this encounter.   [name], MD  [facility] MD/PA/NP OP Progress Note  02/24/2022 1:31 PM MAYLAH CARDELLO  MRN:  [id number]  Chief Complaint:  Chief Complaint  Patient presents with   Depression   Anxiety   Follow-up   HPI: This patient is a 31 year old single white female who lives with her son in [place].  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  The patient returns after 3 months regarding her posttraumatic stress disorder and anxiety.  She states for the most part she is doing okay.  As noted previously her brother committed suicide last November and she had lost her son's father a couple of years ago to a drug overdose.  Her mother is still having a very difficult time with the loss of the patient's brother.  She states that she puts a lot of time and energy into supporting her mother even though she is experiencing her own grief.  She is talking to a therapist at a local church.  She  is trying to urged her mother to go into therapy as well.  For the most part she feels the medications are helpful.  Her mood is generally stable although sometimes she has a bad day.  She is sleeping well.  Her anxiety is under good control.  She denies any thoughts of self-harm or suicide. Visit Diagnosis:    ICD-10-CM   1. PTSD (post-traumatic stress disorder)  F43.10       Past Psychiatric History: none  Past Medical History:  Past Medical History:  Diagnosis Date   Anxiety    Chronic daily headache    Depression    Ovarian cyst     Past Surgical History:  Procedure Laterality Date   CESAREAN SECTION     I & D EXTREMITY Left 03/04/2014   Procedure: IRRIGATION AND DEBRIDEMENT EXTREMITY, REPAIR OF MULTIPLE LACERATIONS, HIP AND KNEE;  Surgeon: Meredith Pel, MD;  Location: [place];  Service: Orthopedics;  Laterality: Left;  Wounds located at left hip and knee.   I & D EXTREMITY Left 03/06/2014   Procedure: IRRIGATION AND DEBRIDEMENT EXTREMITY WITH ABX BEAD PLACEMENT WITH DELAYED PRIMARY CLOSURE.;  Surgeon: Meredith Pel, MD;  Location: [place];  Service: Orthopedics;  Laterality: Left;   LEG SURGERY      Family Psychiatric History: see below  Family History:  Family History  Problem Relation Age of Onset   Depression Mother    Anxiety disorder Mother    Post-traumatic stress  disorder Sister    Alcohol abuse Maternal Grandmother    Depression Maternal Grandmother    Sudden Cardiac Death Neg Hx     Social History:  Social History   Socioeconomic History   Marital status: Single    Spouse name: Not on file   Number of children: Not on file   Years of education: Not on file   Highest education level: Not on file  Occupational History   Not on file  Tobacco Use   Smoking status: Former   Smokeless tobacco: Never  Vaping Use   Vaping Use: Never used  Substance and Sexual Activity   Alcohol use: No    Comment: no alcohol since admit 06/21/16   Drug use: No    Sexual activity: Not Currently    Birth control/protection: I.U.D.  Other Topics Concern   Not on file  Social History Narrative   Not on file   Social Determinants of Health   Financial Resource Strain: Not on file  Food Insecurity: Not on file  Transportation Needs: Not on file  Physical Activity: Not on file  Stress: Not on file  Social Connections: Not on file    Allergies: No Known Allergies  Metabolic Disorder Labs: No results found for: "HGBA1C", "MPG" No results found for: "PROLACTIN" No results found for: "CHOL", "TRIG", "HDL", "CHOLHDL", "VLDL", "LDLCALC" Lab Results  Component Value Date   TSH 0.305 (L) 06/22/2016    Therapeutic Level Labs: No results found for: "LITHIUM" No results found for: "VALPROATE" No results found for: "CBMZ"  Current Medications: Current Outpatient Medications  Medication Sig Dispense Refill   acetaminophen (TYLENOL) 500 MG tablet Take 500 mg by mouth every 6 (six) hours as needed for mild pain or moderate pain.     ALPRAZolam (XANAX) 1 MG tablet Take 1 tablet (1 mg total) by mouth 4 (four) times daily. 120 tablet 2   buPROPion (WELLBUTRIN XL) 300 MG 24 hr tablet Take 1 tablet (300 mg total) by mouth every morning. 30 tablet 2   ibuprofen (ADVIL,MOTRIN) 600 MG tablet Take 1 tablet (600 mg total) by mouth every 6 (six) hours as needed. 30 tablet 0   mirtazapine (REMERON) 45 MG tablet Take 1 tablet (45 mg total) by mouth at bedtime. 30 tablet 2   ondansetron (ZOFRAN-ODT) 4 MG disintegrating tablet Take 1 tablet (4 mg total) by mouth every 8 (eight) hours as needed for nausea or vomiting. 8 tablet 0   zolpidem (AMBIEN) 10 MG tablet Take 1 tablet (10 mg total) by mouth at bedtime as needed for sleep. 30 tablet 2   No current facility-administered medications for this visit.     Musculoskeletal: Strength & Muscle Tone: na Gait & Station: na Patient leans: N/A  Psychiatric Specialty Exam: Review of Systems  All other systems  reviewed and are negative.   There were no vitals taken for this visit.There is no height or weight on file to calculate BMI.  General Appearance: NA  Eye Contact:  NA  Speech:  Clear and Coherent  Volume:  Normal  Mood:  Anxious and Euthymic  Affect:  NA  Thought Process:  Goal Directed  Orientation:  Full (Time, Place, and Person)  Thought Content: Rumination   Suicidal Thoughts:  No  Homicidal Thoughts:  No  Memory:  Immediate;   Good Recent;   Good Remote;   Good  Judgement:  Good  Insight:  Good  Psychomotor Activity:  Normal  Concentration:  Concentration: Good and Attention  Span: Good  Recall:  Good  Fund of Knowledge: Good  Language: Good  Akathisia:  No  Handed:  Right  AIMS (if indicated): not done  Assets:  Communication Skills Desire for Improvement Physical Health Resilience Social Support Talents/Skills  ADL's:  Intact  Cognition: WNL  Sleep:  Good   Screenings: PHQ2-9    Flowsheet Row Video Visit from 02/24/2022 in [facility] [facility] Video Visit from 12/22/2021 in [facility] Video Visit from 09/26/2021 in [facility] Video Visit from 05/01/2021 in [facility] Video Visit from 12/17/2020 in [facility]  PHQ-2 Total Score 1 0 1 1 1       Flowsheet Row Video Visit from 02/24/2022 in [facility] Video Visit from 12/22/2021 in [facility] Video Visit from 05/01/2021 in [facility] No Risk No Risk No Risk        Assessment and Plan: This patient is a 31 year old female with a history of posttraumatic stress disorder depression anxiety and insomnia.  She and her family have experienced significant losses.  She is  however doing well on her current regimen.  She will continue Ambien 10 mg at bedtime for sleep, mirtazapine 45 mg at bedtime for sleep and anxiety, Wellbutrin 300 mg daily for depression and Xanax 1 mg 4 times daily for anxiety.  She will return to see me in 3 months  Collaboration of Care: Collaboration of Care: Primary Care Provider AEB notes will be shared with PCP at patient's request  Patient/Guardian was advised Release of Information must be obtained prior to any record release in order to collaborate their care with an outside provider. Patient/Guardian was advised if they have not already done so to contact the registration department to sign all necessary forms in order for [place] to release information regarding their care.   Consent: Patient/Guardian gives verbal consent for treatment and assignment of benefits for services provided during this visit. Patient/Guardian expressed understanding and agreed to proceed.    [name], MD 02/24/2022, 1:31 PM

## 2022-06-12 ENCOUNTER — Telehealth (INDEPENDENT_AMBULATORY_CARE_PROVIDER_SITE_OTHER): Payer: BC Managed Care – PPO | Admitting: Psychiatry

## 2022-06-12 ENCOUNTER — Encounter (HOSPITAL_COMMUNITY): Payer: Self-pay | Admitting: Psychiatry

## 2022-06-12 DIAGNOSIS — F4310 Post-traumatic stress disorder, unspecified: Secondary | ICD-10-CM

## 2022-06-12 MED ORDER — ZOLPIDEM TARTRATE 10 MG PO TABS: 10.0000 mg | ORAL_TABLET | Freq: Every evening | ORAL | 2 refills | Status: DC | PRN

## 2022-06-12 MED ORDER — ALPRAZOLAM 1 MG PO TABS: 1.0000 mg | ORAL_TABLET | Freq: Four times a day (QID) | ORAL | 2 refills | Status: DC

## 2022-06-12 MED ORDER — BUPROPION HCL ER (XL) 300 MG PO TB24: 300.0000 mg | ORAL_TABLET | ORAL | 2 refills | Status: DC

## 2022-06-12 MED ORDER — MIRTAZAPINE 45 MG PO TABS: 45.0000 mg | ORAL_TABLET | Freq: Every day | ORAL | 2 refills | Status: DC

## 2022-06-12 NOTE — Progress Notes (Signed)
Virtual Visit via Telephone Note  I connected with Valerie Rose on 06/12/22 at  9:20 AM EDT by telephone and verified that I am speaking with the correct person using two identifiers.  Location: Patient: home Provider: home office   I discussed the limitations, risks, security and privacy concerns of performing an evaluation and management service by telephone and the availability of in person appointments. I also discussed with the patient that there may be a patient responsible charge related to this service. The patient expressed understanding and agreed to proceed.     I discussed the assessment and treatment plan with the patient. The patient was provided an opportunity to ask questions and all were answered. The patient agreed with the plan and demonstrated an understanding of the instructions.   The patient was advised to call back or seek an in-person evaluation if the symptoms worsen or if the condition fails to improve as anticipated.  I provided 15 minutes of non-face-to-face time during this encounter.   [name], MD  [facility] MD/PA/NP OP Progress Note  06/12/2022 9:31 AM Valerie Rose  MRN:  [id number]  Chief Complaint:  Chief Complaint  Patient presents with   Anxiety   Depression   Follow-up   HPI: This patient is a 31 year old single white female who lives with her son in [place].  She is working for a [employer]  The patient returns for follow-up after 3 months regarding her posttraumatic stress disorder and anxiety.  She states that her life has been fairly quiet recently with no new negative events.  As noted previously her brother committed suicide last November and she had lost her son's father several years ago to a drug overdose.  The patient states overall her mood has been good.  Her son is playing travel baseball so she is staying active and busy.  She is sleeping well with the medications and her anxiety is under good control.  She denies  significant depression thoughts of self-harm or suicide. Visit Diagnosis:    ICD-10-CM   1. PTSD (post-traumatic stress disorder)  F43.10       Past Psychiatric History: none  Past Medical History:  Past Medical History:  Diagnosis Date   Anxiety    Chronic daily headache    Depression    Ovarian cyst     Past Surgical History:  Procedure Laterality Date   CESAREAN SECTION     I & D EXTREMITY Left 03/04/2014   Procedure: IRRIGATION AND DEBRIDEMENT EXTREMITY, REPAIR OF MULTIPLE LACERATIONS, HIP AND KNEE;  Surgeon: Meredith Pel, MD;  Location: [place];  Service: Orthopedics;  Laterality: Left;  Wounds located at left hip and knee.   I & D EXTREMITY Left 03/06/2014   Procedure: IRRIGATION AND DEBRIDEMENT EXTREMITY WITH ABX BEAD PLACEMENT WITH DELAYED PRIMARY CLOSURE.;  Surgeon: Meredith Pel, MD;  Location: [place];  Service: Orthopedics;  Laterality: Left;   LEG SURGERY      Family Psychiatric History: See below  Family History:  Family History  Problem Relation Age of Onset   Depression Mother    Anxiety disorder Mother    Post-traumatic stress disorder Sister    Alcohol abuse Maternal Grandmother    Depression Maternal Grandmother    Sudden Cardiac Death Neg Hx     Social History:  Social History   Socioeconomic History   Marital status: Single    Spouse name: Not on file   Number of children: Not on file  Years of education: Not on file   Highest education level: Not on file  Occupational History   Not on file  Tobacco Use   Smoking status: Former   Smokeless tobacco: Never  Vaping Use   Vaping Use: Never used  Substance and Sexual Activity   Alcohol use: No    Comment: no alcohol since admit 06/21/16   Drug use: No   Sexual activity: Not Currently    Birth control/protection: I.U.D.  Other Topics Concern   Not on file  Social History Narrative   Not on file   Social Determinants of Health   Financial Resource Strain: Not on file  Food  Insecurity: Not on file  Transportation Needs: Not on file  Physical Activity: Not on file  Stress: Not on file  Social Connections: Not on file    Allergies: No Known Allergies  Metabolic Disorder Labs: No results found for: "HGBA1C", "MPG" No results found for: "PROLACTIN" No results found for: "CHOL", "TRIG", "HDL", "CHOLHDL", "VLDL", "LDLCALC" Lab Results  Component Value Date   TSH 0.305 (L) 06/22/2016    Therapeutic Level Labs: No results found for: "LITHIUM" No results found for: "VALPROATE" No results found for: "CBMZ"  Current Medications: Current Outpatient Medications  Medication Sig Dispense Refill   acetaminophen (TYLENOL) 500 MG tablet Take 500 mg by mouth every 6 (six) hours as needed for mild pain or moderate pain.     ALPRAZolam (XANAX) 1 MG tablet Take 1 tablet (1 mg total) by mouth 4 (four) times daily. 120 tablet 2   buPROPion (WELLBUTRIN XL) 300 MG 24 hr tablet Take 1 tablet (300 mg total) by mouth every morning. 30 tablet 2   ibuprofen (ADVIL,MOTRIN) 600 MG tablet Take 1 tablet (600 mg total) by mouth every 6 (six) hours as needed. 30 tablet 0   mirtazapine (REMERON) 45 MG tablet Take 1 tablet (45 mg total) by mouth at bedtime. 30 tablet 2   ondansetron (ZOFRAN-ODT) 4 MG disintegrating tablet Take 1 tablet (4 mg total) by mouth every 8 (eight) hours as needed for nausea or vomiting. 8 tablet 0   zolpidem (AMBIEN) 10 MG tablet Take 1 tablet (10 mg total) by mouth at bedtime as needed for sleep. 30 tablet 2   No current facility-administered medications for this visit.     Musculoskeletal: Strength & Muscle Tone: na Gait & Station: na Patient leans: N/A  Psychiatric Specialty Exam: Review of Systems  All other systems reviewed and are negative.   There were no vitals taken for this visit.There is no height or weight on file to calculate BMI.  General Appearance: NA  Eye Contact:  NA  Speech:  Clear and Coherent  Volume:  Normal  Mood:  Euthymic   Affect:  NA  Thought Process:  Goal Directed  Orientation:  Full (Time, Place, and Person)  Thought Content: WDL   Suicidal Thoughts:  No  Homicidal Thoughts:  No  Memory:  Immediate;   Good Recent;   Good Remote;   Good  Judgement:  Good  Insight:  Fair  Psychomotor Activity:  Normal  Concentration:  Concentration: Good and Attention Span: Good  Recall:  Good  Fund of Knowledge: Good  Language: Good  Akathisia:  No  Handed:  Right  AIMS (if indicated): not done  Assets:  Communication Skills Desire for Improvement Physical Health Resilience Social Support Talents/Skills  ADL's:  Intact  Cognition: WNL  Sleep:  Good   Screenings: PHQ2-9    Flowsheet  Row Video Visit from 06/12/2022 in [facility] Video Visit from 02/24/2022 in [facility] Video Visit from 12/22/2021 in [facility] Video Visit from 09/26/2021 in [facility] Video Visit from 05/01/2021 in [facility]  PHQ-2 Total Score 0 1 0 1 1      Flowsheet Row Video Visit from 06/12/2022 in [facility] Video Visit from 02/24/2022 in [facility] Video Visit from 12/22/2021 in [facility] No Risk No Risk No Risk        Assessment and Plan: This patient is a 31 year old female with a history of posttraumatic stress disorder and insomnia.  Despite significant losses in her life she is doing well now.  She will continue her current regimen-Ambien 10 mg at bedtime for sleep, mirtazapine 45 mg at bedtime for sleep and anxiety, Wellbutrin XL 300 mg daily for depression and Xanax 1 mg 4 times daily for anxiety.  She will return to see me in 3 months  Collaboration of Care:  Collaboration of Care: Primary Care Provider AEB notes will be shared with PCP at patient request  Patient/Guardian was advised Release of Information must be obtained prior to any record release in order to collaborate their care with an outside provider. Patient/Guardian was advised if they have not already done so to contact the registration department to sign all necessary forms in order for [place] to release information regarding their care.   Consent: Patient/Guardian gives verbal consent for treatment and assignment of benefits for services provided during this visit. Patient/Guardian expressed understanding and agreed to proceed.    [name], MD 06/12/2022, 9:31 AM

## 2022-09-08 ENCOUNTER — Encounter (HOSPITAL_COMMUNITY): Payer: Self-pay | Admitting: Psychiatry

## 2022-09-08 ENCOUNTER — Telehealth (INDEPENDENT_AMBULATORY_CARE_PROVIDER_SITE_OTHER): Payer: BC Managed Care – PPO | Admitting: Psychiatry

## 2022-09-08 DIAGNOSIS — F4310 Post-traumatic stress disorder, unspecified: Secondary | ICD-10-CM

## 2022-09-08 MED ORDER — BUPROPION HCL ER (XL) 300 MG PO TB24: 300.0000 mg | ORAL_TABLET | ORAL | 2 refills | Status: DC

## 2022-09-08 MED ORDER — ALPRAZOLAM 1 MG PO TABS: 1.0000 mg | ORAL_TABLET | Freq: Four times a day (QID) | ORAL | 2 refills | Status: DC

## 2022-09-08 MED ORDER — ZOLPIDEM TARTRATE 10 MG PO TABS: 10.0000 mg | ORAL_TABLET | Freq: Every evening | ORAL | 2 refills | Status: DC | PRN

## 2022-09-08 MED ORDER — MIRTAZAPINE 45 MG PO TABS: 45.0000 mg | ORAL_TABLET | Freq: Every day | ORAL | 2 refills | Status: DC

## 2022-09-08 NOTE — Progress Notes (Signed)
Virtual Visit via Telephone Note  I connected with Valerie Rose on 09/08/22 at  30:40 PM EST by telephone and verified that I am speaking with the correct person using two identifiers.  Location: Patient: home Provider: office   I discussed the limitations, risks, security and privacy concerns of performing an evaluation and management service by telephone and the availability of in person appointments. I also discussed with the patient that there may be a patient responsible charge related to this service. The patient expressed understanding and agreed to proceed.        I discussed the assessment and treatment plan with the patient. The patient was provided an opportunity to ask questions and all were answered. The patient agreed with the plan and demonstrated an understanding of the instructions.   The patient was advised to call back or seek an in-person evaluation if the symptoms worsen or if the condition fails to improve as anticipated.  I provided 20 minutes of non-face-to-face time during this encounter.   [name], MD  [facility] MD/PA/NP OP Progress Note  09/08/2022 1:45 PM Valerie Rose  MRN:  [id number]  Chief Complaint:  Chief Complaint  Patient presents with   Anxiety   Depression   Follow-up   HPI: This patient is a 31 year old single white female who lives with her son in [place].  She works for a [employer].  The patient returns after 3 months regarding her posttraumatic stress disorder and anxiety.  Overall she states that she is stable with no new changes in her condition.  She is enjoying her job and time with her son.  She is sleeping well on her current regimen.  She denies thoughts of self-harm or suicide and states that her depression is under good control Visit Diagnosis:    ICD-10-CM   1. PTSD (post-traumatic stress disorder)  F43.10       Past Psychiatric History: none  Past Medical History:  Past Medical History:  Diagnosis Date    Anxiety    Chronic daily headache    Depression    Ovarian cyst     Past Surgical History:  Procedure Laterality Date   CESAREAN SECTION     I & D EXTREMITY Left 03/04/2014   Procedure: IRRIGATION AND DEBRIDEMENT EXTREMITY, REPAIR OF MULTIPLE LACERATIONS, HIP AND KNEE;  Surgeon: Cammy Copa, MD;  Location: [facility];  Service: Orthopedics;  Laterality: Left;  Wounds located at left hip and knee.   I & D EXTREMITY Left 03/06/2014   Procedure: IRRIGATION AND DEBRIDEMENT EXTREMITY WITH ABX BEAD PLACEMENT WITH DELAYED PRIMARY CLOSURE.;  Surgeon: Cammy Copa, MD;  Location: [facility];  Service: Orthopedics;  Laterality: Left;   LEG SURGERY      Family Psychiatric History: see below  Family History:  Family History  Problem Relation Age of Onset   Depression Mother    Anxiety disorder Mother    Post-traumatic stress disorder Sister    Alcohol abuse Maternal Grandmother    Depression Maternal Grandmother    Sudden Cardiac Death Neg Hx     Social History:  Social History   Socioeconomic History   Marital status: Single    Spouse name: Not on file   Number of children: Not on file   Years of education: Not on file   Highest education level: Not on file  Occupational History   Not on file  Tobacco Use   Smoking status: Former   Smokeless tobacco: Never  Vaping Use   Vaping Use: Never used  Substance and Sexual Activity   Alcohol use: No    Comment: no alcohol since admit 06/21/16   Drug use: No   Sexual activity: Not Currently    Birth control/protection: I.U.D.  Other Topics Concern   Not on file  Social History Narrative   Not on file   Social Determinants of Health   Financial Resource Strain: Not on file  Food Insecurity: Not on file  Transportation Needs: Not on file  Physical Activity: Not on file  Stress: Not on file  Social Connections: Not on file    Allergies: No Known Allergies  Metabolic Disorder Labs: No results found for: "HGBA1C",  "MPG" No results found for: "PROLACTIN" No results found for: "CHOL", "TRIG", "HDL", "CHOLHDL", "VLDL", "LDLCALC" Lab Results  Component Value Date   TSH 0.305 (L) 06/22/2016    Therapeutic Level Labs: No results found for: "LITHIUM" No results found for: "VALPROATE" No results found for: "CBMZ"  Current Medications: Current Outpatient Medications  Medication Sig Dispense Refill   acetaminophen (TYLENOL) 500 MG tablet Take 500 mg by mouth every 6 (six) hours as needed for mild pain or moderate pain.     ALPRAZolam (XANAX) 1 MG tablet Take 1 tablet (1 mg total) by mouth 4 (four) times daily. 120 tablet 2   buPROPion (WELLBUTRIN XL) 300 MG 24 hr tablet Take 1 tablet (300 mg total) by mouth every morning. 30 tablet 2   ibuprofen (ADVIL,MOTRIN) 600 MG tablet Take 1 tablet (600 mg total) by mouth every 6 (six) hours as needed. 30 tablet 0   mirtazapine (REMERON) 45 MG tablet Take 1 tablet (45 mg total) by mouth at bedtime. 30 tablet 2   ondansetron (ZOFRAN-ODT) 4 MG disintegrating tablet Take 1 tablet (4 mg total) by mouth every 8 (eight) hours as needed for nausea or vomiting. 8 tablet 0   zolpidem (AMBIEN) 10 MG tablet Take 1 tablet (10 mg total) by mouth at bedtime as needed for sleep. 30 tablet 2   No current facility-administered medications for this visit.     Musculoskeletal: Strength & Muscle Tone: within normal limits Gait & Station: normal Patient leans: N/A  Psychiatric Specialty Exam: Review of Systems  All other systems reviewed and are negative.   There were no vitals taken for this visit.There is no height or weight on file to calculate BMI.  General Appearance: NA  Eye Contact:  NA  Speech:  Clear and Coherent  Volume:  Normal  Mood:  Euthymic  Affect:  NA  Thought Process:  Goal Directed  Orientation:  Full (Time, Place, and Person)  Thought Content: WDL   Suicidal Thoughts:  No  Homicidal Thoughts:  No  Memory:  Immediate;   Good Recent;   Good Remote;    Good  Judgement:  Good  Insight:  Good  Psychomotor Activity:  Normal  Concentration:  Concentration: Good and Attention Span: Good  Recall:  Good  Fund of Knowledge: Good  Language: Good  Akathisia:  No  Handed:  Right  AIMS (if indicated): not done  Assets:  Communication Skills Desire for Improvement Physical Health Resilience Social Support Talents/Skills  ADL's:  Intact  Cognition: WNL  Sleep:  Good   Screenings: PHQ2-9    Flowsheet Row [facility] Video Visit from 02/24/2022 in [facility] Video Visit from 12/22/2021 in [facility] Video Visit from  09/26/2021 in [facility] Video Visit from 05/01/2021 in [facility]  PHQ-2 Total Score 0 1 0 1 1      Flowsheet Row Video Visit from 06/12/2022 in [facility] Video Visit from 02/24/2022 in [facility] Video Visit from 12/22/2021 [facility]  [facility] RISK CATEGORY No Risk No Risk No Risk        Assessment and Plan: This patient is a 31 year old female with a history of posttraumatic stress disorder and insomnia. Despite significant losses in her life she is doing well now. She will continue her current regimen-Ambien 10 mg at bedtime for sleep, mirtazapine 45 mg at bedtime for sleep and anxiety, Wellbutrin XL 300 mg daily for depression and Xanax 1 mg 4 times daily for anxiety. She will return to see me in 3 months   Collaboration of Care: Collaboration of Care: Notes will be shared with PCP at patient request  Patient/Guardian was advised Release of Information must be obtained prior to any record release in order to collaborate their care with an outside  provider. Patient/Guardian was advised if they have not already done so to contact the registration department to sign all necessary forms in order for [place] to release information regarding their care.   Consent: Patient/Guardian gives verbal consent for treatment and assignment of benefits for services provided during this visit. Patient/Guardian expressed understanding and agreed to proceed.    [name], MD 09/08/2022, 1:45 PM

## 2022-11-30 ENCOUNTER — Other Ambulatory Visit (HOSPITAL_COMMUNITY): Payer: Self-pay | Admitting: Psychiatry

## 2022-12-01 ENCOUNTER — Telehealth (INDEPENDENT_AMBULATORY_CARE_PROVIDER_SITE_OTHER): Payer: BC Managed Care – PPO | Admitting: Psychiatry

## 2022-12-01 ENCOUNTER — Encounter (HOSPITAL_COMMUNITY): Payer: Self-pay | Admitting: Psychiatry

## 2022-12-01 DIAGNOSIS — F4310 Post-traumatic stress disorder, unspecified: Secondary | ICD-10-CM

## 2022-12-01 MED ORDER — BUPROPION HCL ER (XL) 300 MG PO TB24: 300.0000 mg | ORAL_TABLET | ORAL | 2 refills | Status: DC

## 2022-12-01 MED ORDER — ALPRAZOLAM 1 MG PO TABS: 1.0000 mg | ORAL_TABLET | Freq: Four times a day (QID) | ORAL | 0 refills | Status: DC

## 2022-12-01 MED ORDER — MIRTAZAPINE 45 MG PO TABS: 45.0000 mg | ORAL_TABLET | Freq: Every day | ORAL | 2 refills | Status: DC

## 2022-12-01 MED ORDER — ZOLPIDEM TARTRATE 10 MG PO TABS: 10.0000 mg | ORAL_TABLET | Freq: Every evening | ORAL | 2 refills | Status: DC | PRN

## 2022-12-01 NOTE — Progress Notes (Signed)
Virtual Visit via Video Note  I connected with Valerie Rose on 12/01/22 at  3:40 PM EDT by a video enabled telemedicine application and verified that I am speaking with the correct person using two identifiers.  Location: Patient: home Provider: office   I discussed the limitations of evaluation and management by telemedicine and the availability of in person appointments. The patient expressed understanding and agreed to proceed.    I discussed the assessment and treatment plan with the patient. The patient was provided an opportunity to ask questions and all were answered. The patient agreed with the plan and demonstrated an understanding of the instructions.   The patient was advised to call back or seek an in-person evaluation if the symptoms worsen or if the condition fails to improve as anticipated.  I provided 12 minutes of non-face-to-face time during this encounter.   [name], MD  [facility] MD/PA/NP OP Progress Note  12/01/2022 3:50 PM Valerie Rose  MRN:  [id number]  Chief Complaint:  Chief Complaint  Patient presents with   Anxiety   Depression   Follow-up   HPI: This patient is a 32 year old single white female who lives with her son in [place].  She works for [employer].  The patient returns after 3 months regarding her posttraumatic stress disorder and anxiety.  She states that she is doing very well.  Her mood has been stable and she denies significant depression or anxiety.  Her mother has recently moved in with her and she is waiting to see how this will turn out.  She is sleeping well on her current regimen.  She denies any nightmares or flashbacks.  She denies thoughts of self-harm or suicide.  She is taking Xanax 1 mg 4 times daily and I urged her to try to start cutting this back to 3 times daily as I would like to see her get this down over the next few months. Visit Diagnosis:    ICD-10-CM   1. PTSD (post-traumatic stress disorder)  F43.10        Past Psychiatric History: none  Past Medical History:  Past Medical History:  Diagnosis Date   Anxiety    Chronic daily headache    Depression    Ovarian cyst     Past Surgical History:  Procedure Laterality Date   CESAREAN SECTION     I & D EXTREMITY Left 03/04/2014   Procedure: IRRIGATION AND DEBRIDEMENT EXTREMITY, REPAIR OF MULTIPLE LACERATIONS, HIP AND KNEE;  Surgeon: Meredith Pel, MD;  Location: [place];  Service: Orthopedics;  Laterality: Left;  Wounds located at left hip and knee.   I & D EXTREMITY Left 03/06/2014   Procedure: IRRIGATION AND DEBRIDEMENT EXTREMITY WITH ABX BEAD PLACEMENT WITH DELAYED PRIMARY CLOSURE.;  Surgeon: Meredith Pel, MD;  Location: [facility];  Service: Orthopedics;  Laterality: Left;   LEG SURGERY      Family Psychiatric History: See below  Family History:  Family History  Problem Relation Age of Onset   Depression Mother    Anxiety disorder Mother    Post-traumatic stress disorder Sister    Alcohol abuse Maternal Grandmother    Depression Maternal Grandmother    Sudden Cardiac Death Neg Hx     Social History:  Social History   Socioeconomic History   Marital status: Single    Spouse name: Not on file   Number of children: Not on file   Years of education: Not on file   Highest  education level: Not on file  Occupational History   Not on file  Tobacco Use   Smoking status: Former   Smokeless tobacco: Never  Vaping Use   Vaping Use: Never used  Substance and Sexual Activity   Alcohol use: No    Comment: no alcohol since admit 06/21/16   Drug use: No   Sexual activity: Not Currently    Birth control/protection: I.U.D.  Other Topics Concern   Not on file  Social History Narrative   Not on file   Social Determinants of Health   Financial Resource Strain: Not on file  Food Insecurity: Not on file  Transportation Needs: Not on file  Physical Activity: Not on file  Stress: Not on file  Social Connections: Not on file     Allergies: No Known Allergies  Metabolic Disorder Labs: No results found for: "HGBA1C", "MPG" No results found for: "PROLACTIN" No results found for: "CHOL", "TRIG", "HDL", "CHOLHDL", "VLDL", "LDLCALC" Lab Results  Component Value Date   TSH 0.305 (L) 06/22/2016    Therapeutic Level Labs: No results found for: "LITHIUM" No results found for: "VALPROATE" No results found for: "CBMZ"  Current Medications: Current Outpatient Medications  Medication Sig Dispense Refill   acetaminophen (TYLENOL) 500 MG tablet Take 500 mg by mouth every 6 (six) hours as needed for mild pain or moderate pain.     ALPRAZolam (XANAX) 1 MG tablet Take 1 tablet (1 mg total) by mouth 4 (four) times daily. 120 tablet 0   buPROPion (WELLBUTRIN XL) 300 MG 24 hr tablet Take 1 tablet (300 mg total) by mouth every morning. 30 tablet 2   ibuprofen (ADVIL,MOTRIN) 600 MG tablet Take 1 tablet (600 mg total) by mouth every 6 (six) hours as needed. 30 tablet 0   mirtazapine (REMERON) 45 MG tablet Take 1 tablet (45 mg total) by mouth at bedtime. 30 tablet 2   ondansetron (ZOFRAN-ODT) 4 MG disintegrating tablet Take 1 tablet (4 mg total) by mouth every 8 (eight) hours as needed for nausea or vomiting. 8 tablet 0   zolpidem (AMBIEN) 10 MG tablet Take 1 tablet (10 mg total) by mouth at bedtime as needed for sleep. 30 tablet 2   No current facility-administered medications for this visit.     Musculoskeletal: Strength & Muscle Tone: within normal limits Gait & Station: normal Patient leans: N/A  Psychiatric Specialty Exam: Review of Systems  All other systems reviewed and are negative.   There were no vitals taken for this visit.There is no height or weight on file to calculate BMI.  General Appearance: Casual and Fairly Groomed  Eye Contact:  Good  Speech:  Clear and Coherent  Volume:  Normal  Mood:  Euthymic  Affect:  Congruent  Thought Process:  Goal Directed  Orientation:  Full (Time, Place, and Person)   Thought Content: WDL   Suicidal Thoughts:  No  Homicidal Thoughts:  No  Memory:  Immediate;   Good Recent;   Good Remote;   Fair  Judgement:  Good  Insight:  Good  Psychomotor Activity:  Normal  Concentration:  Concentration: Good and Attention Span: Good  Recall:  Good  Fund of Knowledge: Good  Language: Good  Akathisia:  No  Handed:  Right  AIMS (if indicated): not done  Assets:  Communication Skills Desire for Improvement Physical Health Resilience Social Support Talents/Skills  ADL's:  Intact  Cognition: WNL  Sleep:  Good   Screenings: PHQ2-9    Flowsheet Row Video Visit from  06/12/2022 in [place] at [place] Video Visit from 02/24/2022 in [place] at [place] Video Visit from 12/22/2021 in [place] at [place] Video Visit from 09/26/2021 in [place] at [place] Video Visit from 05/01/2021 in [place] [facility] Total Score 0 1 0 1 1      Flowsheet Row Video Visit from 06/12/2022 in [place] at [place] Video Visit from 02/24/2022 in [place] at [place] Video Visit from 12/22/2021 in [place] at [place] No Risk No Risk No Risk        Assessment and Plan: This patient is a 32 year old female with a history of PTSD and insomnia.  She continues to do well on her current regimen.  She will continue Ambien 10 mg at bedtime for sleep, mirtazapine 45 mg at bedtime for sleep and anxiety, Wellbutrin XL 300 mg daily for depression and Xanax 1 mg 4 times daily for anxiety.  She will return to see me in 3 months  Collaboration of Care: Collaboration of Care: Primary Care Provider AEB notes will be shared with PCP at patient's request  Patient/Guardian was advised Release of Information  must be obtained prior to any record release in order to collaborate their care with an outside provider. Patient/Guardian was advised if they have not already done so to contact the registration department to sign all necessary forms in order for [place] to release information regarding their care.   Consent: Patient/Guardian gives verbal consent for treatment and assignment of benefits for services provided during this visit. Patient/Guardian expressed understanding and agreed to proceed.    [name], MD 12/01/2022, 3:50 PM

## 2023-02-24 ENCOUNTER — Telehealth (INDEPENDENT_AMBULATORY_CARE_PROVIDER_SITE_OTHER): Payer: BC Managed Care – PPO | Admitting: Psychiatry

## 2023-02-24 ENCOUNTER — Encounter (HOSPITAL_COMMUNITY): Payer: Self-pay | Admitting: Psychiatry

## 2023-02-24 DIAGNOSIS — F4310 Post-traumatic stress disorder, unspecified: Secondary | ICD-10-CM | POA: Diagnosis not present

## 2023-02-24 MED ORDER — MIRTAZAPINE 45 MG PO TABS: 45.0000 mg | ORAL_TABLET | Freq: Every day | ORAL | 2 refills | Status: DC

## 2023-02-24 MED ORDER — ALPRAZOLAM 1 MG PO TABS: 1.0000 mg | ORAL_TABLET | Freq: Four times a day (QID) | ORAL | 0 refills | Status: DC

## 2023-02-24 MED ORDER — BUPROPION HCL ER (XL) 300 MG PO TB24: 300.0000 mg | ORAL_TABLET | ORAL | 2 refills | Status: DC

## 2023-02-24 MED ORDER — ZOLPIDEM TARTRATE 10 MG PO TABS: 10.0000 mg | ORAL_TABLET | Freq: Every evening | ORAL | 2 refills | Status: DC | PRN

## 2023-02-24 NOTE — Progress Notes (Signed)
Virtual Visit via Telephone Note  I connected with Valerie Rose on 02/24/23 at 11:20 AM EDT by telephone and verified that I am speaking with the correct person using two identifiers.  Location: Patient: home Provider: office   I discussed the limitations, risks, security and privacy concerns of performing an evaluation and management service by telephone and the availability of in person appointments. I also discussed with the patient that there may be a patient responsible charge related to this service. The patient expressed understanding and agreed to proceed.       I discussed the assessment and treatment plan with the patient. The patient was provided an opportunity to ask questions and all were answered. The patient agreed with the plan and demonstrated an understanding of the instructions.   The patient was advised to call back or seek an in-person evaluation if the symptoms worsen or if the condition fails to improve as anticipated.  I provided 15 minutes of non-face-to-face time during this encounter.   [name], MD  [facility] MD/PA/NP OP Progress Note  02/24/2023 11:39 AM Valerie Rose  MRN:  [id number]  Chief Complaint:  Chief Complaint  Patient presents with   Anxiety   Depression   Follow-up   HPI: This patient is a 32 year old single white female who lives with her son in [place].  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  The patient returns after 3 months regarding her posttraumatic stress disorder and anxiety.  She states that she is currently doing well.  However in April she was seen in [facility] ED after she had was having atypical chest pain.  It was deemed to be a panic attack.  She states this was after she had placed her father under involuntary commitment after he was making threats to commit suicide.  She was very upset because the hospital let him go without admission.  He had also "cursed me out" because of the IVC.  Since then I have  not really spoken.  For the most part she has been doing okay since then and feels that the medications are helping her depression and anxiety.  She is sleeping well.  She denies any thoughts of self-harm. Visit Diagnosis:    ICD-10-CM   1. PTSD (post-traumatic stress disorder)  F43.10       Past Psychiatric History: none  Past Medical History:  Past Medical History:  Diagnosis Date   Anxiety    Chronic daily headache    Depression    Ovarian cyst     Past Surgical History:  Procedure Laterality Date   CESAREAN SECTION     I & D EXTREMITY Left 03/04/2014   Procedure: IRRIGATION AND DEBRIDEMENT EXTREMITY, REPAIR OF MULTIPLE LACERATIONS, HIP AND KNEE;  Surgeon: Cammy Copa, MD;  Location: [facility] OR;  Service: Orthopedics;  Laterality: Left;  Wounds located at left hip and knee.   I & D EXTREMITY Left 03/06/2014   Procedure: IRRIGATION AND DEBRIDEMENT EXTREMITY WITH ABX BEAD PLACEMENT WITH DELAYED PRIMARY CLOSURE.;  Surgeon: Cammy Copa, MD;  Location: [facility] OR;  Service: Orthopedics;  Laterality: Left;   LEG SURGERY      Family Psychiatric History: see below  Family History:  Family History  Problem Relation Age of Onset   Depression Mother    Anxiety disorder Mother    Post-traumatic stress disorder Sister    Alcohol abuse Maternal Grandmother    Depression Maternal Grandmother    Sudden Cardiac Death Neg  Hx     Social History:  Social History   Socioeconomic History   Marital status: Single    Spouse name: Not on file   Number of children: Not on file   Years of education: Not on file   Highest education level: Not on file  Occupational History   Not on file  Tobacco Use   Smoking status: Former   Smokeless tobacco: Never  Vaping Use   Vaping Use: Never used  Substance and Sexual Activity   Alcohol use: No    Comment: no alcohol since admit 06/21/16   Drug use: No   Sexual activity: Not Currently    Birth control/protection: I.U.D.  Other Topics  Concern   Not on file  Social History Narrative   Not on file   Social Determinants of Health   Financial Resource Strain: Not on file  Food Insecurity: Not on file  Transportation Needs: Not on file  Physical Activity: Not on file  Stress: Not on file  Social Connections: Not on file    Allergies: No Known Allergies  Metabolic Disorder Labs: No results found for: "HGBA1C", "MPG" No results found for: "PROLACTIN" No results found for: "CHOL", "TRIG", "HDL", "CHOLHDL", "VLDL", "LDLCALC" Lab Results  Component Value Date   TSH 0.305 (L) 06/22/2016    Therapeutic Level Labs: No results found for: "LITHIUM" No results found for: "VALPROATE" No results found for: "CBMZ"  Current Medications: Current Outpatient Medications  Medication Sig Dispense Refill   acetaminophen (TYLENOL) 500 MG tablet Take 500 mg by mouth every 6 (six) hours as needed for mild pain or moderate pain.     ALPRAZolam (XANAX) 1 MG tablet Take 1 tablet (1 mg total) by mouth 4 (four) times daily. 120 tablet 0   buPROPion (WELLBUTRIN XL) 300 MG 24 hr tablet Take 1 tablet (300 mg total) by mouth every morning. 30 tablet 2   ibuprofen (ADVIL,MOTRIN) 600 MG tablet Take 1 tablet (600 mg total) by mouth every 6 (six) hours as needed. 30 tablet 0   mirtazapine (REMERON) 45 MG tablet Take 1 tablet (45 mg total) by mouth at bedtime. 30 tablet 2   ondansetron (ZOFRAN-ODT) 4 MG disintegrating tablet Take 1 tablet (4 mg total) by mouth every 8 (eight) hours as needed for nausea or vomiting. 8 tablet 0   zolpidem (AMBIEN) 10 MG tablet Take 1 tablet (10 mg total) by mouth at bedtime as needed for sleep. 30 tablet 2   No current facility-administered medications for this visit.     Musculoskeletal: Strength & Muscle Tone: na Gait & Station: na Patient leans: N/A  Psychiatric Specialty Exam: Review of Systems  There were no vitals taken for this visit.There is no height or weight on file to calculate BMI.  General  Appearance: NA  Eye Contact:  NA  Speech:  Clear and Coherent  Volume:  Normal  Mood:  Euthymic  Affect:  Congruent  Thought Process:  Goal Directed  Orientation:  Full (Time, Place, and Person)  Thought Content: WDL   Suicidal Thoughts:  No  Homicidal Thoughts:  No  Memory:  Immediate;   Good Recent;   Good Remote;   Fair  Judgement:  Good  Insight:  Fair  Psychomotor Activity:  Normal  Concentration:  Concentration: Good and Attention Span: Good  Recall:  Good  Fund of Knowledge: Good  Language: Good  Akathisia:  No  Handed:  Right  AIMS (if indicated): not done  Assets:  Communication Skills  Desire for Improvement Physical Health Resilience Social Support Talents/Skills  ADL's:  Intact  Cognition: WNL  Sleep:  Good   Screenings: PHQ2-9    Flowsheet Row Video Visit from 06/12/2022 in [facility] at [place] Video Visit from 02/24/2022 in [facility] at [place] Video Visit from 12/22/2021 in [facility] at [place] Video Visit from 09/26/2021 in [facility] at [place] Video Visit from 05/01/2021 in [facility] at [facility] Total Score 0 1 0 1 1      Flowsheet Row Video Visit from 06/12/2022 in [place] [facility] at [place] Video Visit from 02/24/2022 in [facility] Outpatient Behavioral Health at [place] Video Visit from 12/22/2021 in [facility] Outpatient Behavioral Health at [place]  C-SSRS RISK CATEGORY No Risk No Risk No Risk        Assessment and Plan: This patient is a 32 year old female with a history of PTSD and insomnia.  She continues to do well for the most part on her current regimen.  She will continue Ambien 10 mg at bedtime for sleep, mirtazapine 45 mg at bedtime for sleep and anxiety, Wellbutrin XL 300 mg daily for depression and Xanax 1 mg 4 times daily for anxiety.   She will return to see me in 3 months  Collaboration of Care: Collaboration of Care: Primary Care Provider AEB notes will be shared with PCP at patient's request  Patient/Guardian was advised Release of Information must be obtained prior to any record release in order to collaborate their care with an outside provider. Patient/Guardian was advised if they have not already done so to contact the registration department to sign all necessary forms in order for [place] to release information regarding their care.   Consent: Patient/Guardian gives verbal consent for treatment and assignment of benefits for services provided during this visit. Patient/Guardian expressed understanding and agreed to proceed.    [name], MD 02/24/2023, 11:39 AM

## 2023-03-26 ENCOUNTER — Encounter (HOSPITAL_COMMUNITY): Payer: Self-pay

## 2023-03-26 ENCOUNTER — Other Ambulatory Visit (HOSPITAL_COMMUNITY): Payer: Self-pay | Admitting: Psychiatry

## 2023-03-29 ENCOUNTER — Telehealth (HOSPITAL_COMMUNITY): Payer: Self-pay

## 2023-03-29 ENCOUNTER — Other Ambulatory Visit (HOSPITAL_COMMUNITY): Payer: Self-pay | Admitting: Psychiatry

## 2023-03-29 MED ORDER — ALPRAZOLAM 1 MG PO TABS: 1.0000 mg | ORAL_TABLET | Freq: Four times a day (QID) | ORAL | 2 refills | Status: DC

## 2023-03-29 NOTE — Telephone Encounter (Signed)
Medication refill request - Call message from patient requesting Dr. Tenny Craw send in her new Alprazolam order, last provided 02/24/23 but with no refills. Patient last seen on 02/24/23 and has not scheduled her next follow up appointment. Patient requests this new order be sent to [facility] to fill.

## 2023-03-29 NOTE — Telephone Encounter (Signed)
Medication management - Telephone call with patient to inform Dr. Tenny Craw had sent in her requested Alprazolam order to [facility]. Called [facility] with [name] and requested they cancel her last Alprazolam order and any on file as Dr. Tenny Craw had inadvertently sent the order to them by mistake when it was supposed to go to [facility].  Collateral agreed and will cancel out orders there.

## 2023-03-29 NOTE — Telephone Encounter (Signed)
Sent. I initially sent this to [facility], please call them to cancel

## 2023-05-28 ENCOUNTER — Other Ambulatory Visit (HOSPITAL_COMMUNITY): Payer: Self-pay | Admitting: Psychiatry

## 2023-06-02 ENCOUNTER — Telehealth (INDEPENDENT_AMBULATORY_CARE_PROVIDER_SITE_OTHER): Payer: Medicaid Other | Admitting: Psychiatry

## 2023-06-02 ENCOUNTER — Encounter (HOSPITAL_COMMUNITY): Payer: Self-pay | Admitting: Psychiatry

## 2023-06-02 DIAGNOSIS — F4310 Post-traumatic stress disorder, unspecified: Secondary | ICD-10-CM | POA: Diagnosis not present

## 2023-06-02 MED ORDER — ALPRAZOLAM 1 MG PO TABS: 1.0000 mg | ORAL_TABLET | Freq: Four times a day (QID) | ORAL | 2 refills | Status: DC

## 2023-06-02 MED ORDER — BUPROPION HCL ER (XL) 300 MG PO TB24: 300.0000 mg | ORAL_TABLET | ORAL | 2 refills | Status: DC

## 2023-06-02 MED ORDER — ZOLPIDEM TARTRATE 10 MG PO TABS: 10.0000 mg | ORAL_TABLET | Freq: Every evening | ORAL | 2 refills | Status: DC | PRN

## 2023-06-02 MED ORDER — MIRTAZAPINE 45 MG PO TABS: 45.0000 mg | ORAL_TABLET | Freq: Every day | ORAL | 2 refills | Status: DC

## 2023-06-02 NOTE — Progress Notes (Signed)
Virtual Visit via Video Note  I connected with Valerie Rose on 06/02/23 at 11:00 AM EDT by a video enabled telemedicine application and verified that I am speaking with the correct person using two identifiers.  Location: Patient: home Provider: office   I discussed the limitations of evaluation and management by telemedicine and the availability of in person appointments. The patient expressed understanding and agreed to proceed.      I discussed the assessment and treatment plan with the patient. The patient was provided an opportunity to ask questions and all were answered. The patient agreed with the plan and demonstrated an understanding of the instructions.   The patient was advised to call back or seek an in-person evaluation if the symptoms worsen or if the condition fails to improve as anticipated.  I provided 15 minutes of non-face-to-face time during this encounter.   [name], MD  [facility] MD/PA/NP OP Progress Note  06/02/2023 11:09 AM Valerie Rose  MRN:  [id number]  Chief Complaint:  Chief Complaint  Patient presents with   Anxiety   Depression   Follow-up   HPI: This patient is a 32 year old single white female lives with her son in [place].  She works for a [employer].  The patient returns after 3 months regarding her posttraumatic stress disorder and anxiety.  She states that she and her son are just now recovering from COVID.  She was pretty sick but Paxil bid helped her get through it.  She is still coughing a fair amount.  Overall she states that the autumn is a difficult time for her because this is when she has lost family members but she feels that the medication is helping a good deal with her depression and anxiety.  She is still functioning well at work.  She is sleeping well denies any thoughts of self-harm or suicide. Visit Diagnosis:    ICD-10-CM   1. PTSD (post-traumatic stress disorder)  F43.10       Past Psychiatric History:  none  Past Medical History:  Past Medical History:  Diagnosis Date   Anxiety    Chronic daily headache    Depression    Ovarian cyst     Past Surgical History:  Procedure Laterality Date   CESAREAN SECTION     I & D EXTREMITY Left 03/04/2014   Procedure: IRRIGATION AND DEBRIDEMENT EXTREMITY, REPAIR OF MULTIPLE LACERATIONS, HIP AND KNEE;  Surgeon: Cammy Copa, MD;  Location: [facility] OR;  Service: Orthopedics;  Laterality: Left;  Wounds located at left hip and knee.   I & D EXTREMITY Left 03/06/2014   Procedure: IRRIGATION AND DEBRIDEMENT EXTREMITY WITH ABX BEAD PLACEMENT WITH DELAYED PRIMARY CLOSURE.;  Surgeon: Cammy Copa, MD;  Location: [facility] OR;  Service: Orthopedics;  Laterality: Left;   LEG SURGERY      Family Psychiatric History: See below  Family History:  Family History  Problem Relation Age of Onset   Depression Mother    Anxiety disorder Mother    Post-traumatic stress disorder Sister    Alcohol abuse Maternal Grandmother    Depression Maternal Grandmother    Sudden Cardiac Death Neg Hx     Social History:  Social History   Socioeconomic History   Marital status: Single    Spouse name: Not on file   Number of children: Not on file   Years of education: Not on file   Highest education level: Not on file  Occupational History   Not on  file  Tobacco Use   Smoking status: Former   Smokeless tobacco: Never  Vaping Use   Vaping status: Never Used  Substance and Sexual Activity   Alcohol use: No    Comment: no alcohol since admit 06/21/16   Drug use: No   Sexual activity: Not Currently    Birth control/protection: I.U.D.  Other Topics Concern   Not on file  Social History Narrative   Not on file   Social Determinants of Health   Financial Resource Strain: Not on file  Food Insecurity: Not on file  Transportation Needs: Not on file  Physical Activity: Not on file  Stress: Not on file  Social Connections: Not on file    Allergies: No Known  Allergies  Metabolic Disorder Labs: No results found for: "HGBA1C", "MPG" No results found for: "PROLACTIN" No results found for: "CHOL", "TRIG", "HDL", "CHOLHDL", "VLDL", "LDLCALC" Lab Results  Component Value Date   TSH 0.305 (L) 06/22/2016    Therapeutic Level Labs: No results found for: "LITHIUM" No results found for: "VALPROATE" No results found for: "CBMZ"  Current Medications: Current Outpatient Medications  Medication Sig Dispense Refill   acetaminophen (TYLENOL) 500 MG tablet Take 500 mg by mouth every 6 (six) hours as needed for mild pain or moderate pain.     ALPRAZolam (XANAX) 1 MG tablet Take 1 tablet (1 mg total) by mouth 4 (four) times daily. 120 tablet 2   buPROPion (WELLBUTRIN XL) 300 MG 24 hr tablet Take 1 tablet (300 mg total) by mouth every morning. 30 tablet 2   ibuprofen (ADVIL,MOTRIN) 600 MG tablet Take 1 tablet (600 mg total) by mouth every 6 (six) hours as needed. 30 tablet 0   mirtazapine (REMERON) 45 MG tablet Take 1 tablet (45 mg total) by mouth at bedtime. 30 tablet 2   ondansetron (ZOFRAN-ODT) 4 MG disintegrating tablet Take 1 tablet (4 mg total) by mouth every 8 (eight) hours as needed for nausea or vomiting. 8 tablet 0   zolpidem (AMBIEN) 10 MG tablet Take 1 tablet (10 mg total) by mouth at bedtime as needed for sleep. 30 tablet 2   No current facility-administered medications for this visit.     Musculoskeletal: Strength & Muscle Tone: within normal limits Gait & Station: normal Patient leans: N/A  Psychiatric Specialty Exam: Review of Systems  Respiratory:  Positive for cough.   All other systems reviewed and are negative.   There were no vitals taken for this visit.There is no height or weight on file to calculate BMI.  General Appearance: Casual and Fairly Groomed  Eye Contact:  Good  Speech:  Clear and Coherent  Volume:  Normal  Mood:  Euthymic  Affect:  Congruent  Thought Process:  Goal Directed  Orientation:  Full (Time, Place,  and Person)  Thought Content: WDL   Suicidal Thoughts:  No  Homicidal Thoughts:  No  Memory:  Immediate;   Good Recent;   Good Remote;   Fair  Judgement:  Good  Insight:  Fair  Psychomotor Activity:  Normal  Concentration:  Concentration: Good and Attention Span: Good  Recall:  Good  Fund of Knowledge: Good  Language: Good  Akathisia:  No  Handed:  Right  AIMS (if indicated): not done  Assets:  Communication Skills Desire for Improvement Physical Health Resilience Social Support Talents/Skills  ADL's:  Intact  Cognition: WNL  Sleep:  Good   Screenings: PHQ2-9    Flowsheet Row Video Visit from 06/12/2022 in [facility] at [facility] Video Visit from 02/24/2022 in [facility] Outpatient Behavioral Health at [place] Video Visit from 12/22/2021 in [facility] Outpatient Behavioral Health at [place] Video Visit from 09/26/2021 in [facility] Outpatient Behavioral Health at [place] Video Visit from 05/01/2021 in [facility] at [facility] Total Score 0 1 0 1 1      Flowsheet Row Video Visit from 06/12/2022 in [place] [facility] at [place] Video Visit from 02/24/2022 in [facility] Outpatient Behavioral Health at [place] Video Visit from 12/22/2021 in [facility] Outpatient Behavioral Health at [place]  C-SSRS RISK CATEGORY No Risk No Risk No Risk        Assessment and Plan: This patient is a 32 year old female with a history of PTSD and insomnia.  For the most part she is doing well on her current regimen.  She will continue Wellbutrin XL 300 mg daily for depression, mirtazapine 45 mg at bedtime for sleep and anxiety, Ambien 10 mg at bedtime for sleep and Xanax 1 mg 4 times daily for anxiety.  She will return to see me in 3 months  Collaboration of Care: Collaboration of Care: Primary Care Provider AEB notes will be shared with PCP at patient's request  Patient/Guardian was advised  Release of Information must be obtained prior to any record release in order to collaborate their care with an outside provider. Patient/Guardian was advised if they have not already done so to contact the registration department to sign all necessary forms in order for [place] to release information regarding their care.   Consent: Patient/Guardian gives verbal consent for treatment and assignment of benefits for services provided during this visit. Patient/Guardian expressed understanding and agreed to proceed.    [name], MD 06/02/2023, 11:09 AM

## 2023-09-29 ENCOUNTER — Encounter (HOSPITAL_COMMUNITY): Payer: Self-pay | Admitting: Psychiatry

## 2023-09-29 ENCOUNTER — Telehealth (INDEPENDENT_AMBULATORY_CARE_PROVIDER_SITE_OTHER): Payer: Medicaid Other | Admitting: Psychiatry

## 2023-09-29 DIAGNOSIS — F4310 Post-traumatic stress disorder, unspecified: Secondary | ICD-10-CM

## 2023-09-29 MED ORDER — MIRTAZAPINE 45 MG PO TABS: 45.0000 mg | ORAL_TABLET | Freq: Every day | ORAL | 2 refills | Status: DC

## 2023-09-29 MED ORDER — ALPRAZOLAM 1 MG PO TABS: 1.0000 mg | ORAL_TABLET | Freq: Four times a day (QID) | ORAL | 2 refills | Status: DC

## 2023-09-29 MED ORDER — ZOLPIDEM TARTRATE 10 MG PO TABS: 10.0000 mg | ORAL_TABLET | Freq: Every evening | ORAL | 2 refills | Status: DC | PRN

## 2023-09-29 MED ORDER — BUPROPION HCL ER (XL) 150 MG PO TB24: 150.0000 mg | ORAL_TABLET | ORAL | 2 refills | Status: DC

## 2023-09-29 MED ORDER — BUPROPION HCL ER (XL) 300 MG PO TB24: 300.0000 mg | ORAL_TABLET | ORAL | 2 refills | Status: DC

## 2023-09-29 NOTE — Progress Notes (Signed)
 Virtual Visit via Video Note  I connected with JAANAI RAIA on 09/29/23 at 10:00 AM EST by a video enabled telemedicine application and verified that I am speaking with the correct person using two identifiers.  Location: Patient: home Provider: office   I discussed the limitations of evaluation and management by telemedicine and the availability of in person appointments. The patient expressed understanding and agreed to proceed.      I discussed the assessment and treatment plan with the patient. The patient was provided an opportunity to ask questions and all were answered. The patient agreed with the plan and demonstrated an understanding of the instructions.   The patient was advised to call back or seek an in-person evaluation if the symptoms worsen or if the condition fails to improve as anticipated.  I provided 20 minutes of non-face-to-face time during this encounter.   Alfredia Annas, MD  [facility] MD/PA/NP OP Progress Note  09/29/2023 10:03 AM Staci Dykes  MRN:  [id number]  Chief Complaint:  Chief Complaint  Patient presents with   Anxiety   Depression   Follow-up   HPI: This patient is a 33 year old single white female lives with her son in [place].  She works her [employer].  The patient returns for follow-up after 4 months regarding her posttraumatic stress disorder and anxiety.  She states that in 08-Aug-2024 her maternal grandmother passed away.  In previous Aug 08, 2024 she had lost both her father and her son's father.  She states that 2024-08-08 in the wintertime in general have been very difficult for her.  She states it is hard for her to get going in the morning and she feels depressed and poorly motivated.  However after point she is she is feeling better by midmorning and is able to work and take care of her son.  I suggested that she look into some lites that help with seasonal affective disorder and she will do this.  We can also increase the Wellbutrin .   She is already tried Prozac  Zoloft and Paxil .  She is sleeping well in fact sometimes too much.  She is staying very busy taking her son to all sporting events.  She denies any thoughts of self-harm or suicide Visit Diagnosis:    ICD-10-CM   1. PTSD (post-traumatic stress disorder)  F43.10       Past Psychiatric History: none  Past Medical History:  Past Medical History:  Diagnosis Date   Anxiety    Chronic daily headache    Depression    Ovarian cyst     Past Surgical History:  Procedure Laterality Date   CESAREAN SECTION     I & D EXTREMITY Left 03/04/2014   Procedure: IRRIGATION AND DEBRIDEMENT EXTREMITY, REPAIR OF MULTIPLE LACERATIONS, HIP AND KNEE;  Surgeon: Jasmine Mesi, MD;  Location: [facility];  Service: Orthopedics;  Laterality: Left;  Wounds located at left hip and knee.   I & D EXTREMITY Left 03/06/2014   Procedure: IRRIGATION AND DEBRIDEMENT EXTREMITY WITH ABX BEAD PLACEMENT WITH DELAYED PRIMARY CLOSURE.;  Surgeon: Jasmine Mesi, MD;  Location: [facility] OR;  Service: Orthopedics;  Laterality: Left;   LEG SURGERY      Family Psychiatric History: See below  Family History:  Family History  Problem Relation Age of Onset   Depression Mother    Anxiety disorder Mother    Post-traumatic stress disorder Sister    Alcohol abuse Maternal Grandmother    Depression Maternal Grandmother    Sudden  Cardiac Death Neg Hx     Social History:  Social History   Socioeconomic History   Marital status: Single    Spouse name: Not on file   Number of children: Not on file   Years of education: Not on file   Highest education level: Not on file  Occupational History   Not on file  Tobacco Use   Smoking status: Former   Smokeless tobacco: Never  Vaping Use   Vaping status: Never Used  Substance and Sexual Activity   Alcohol use: No    Comment: no alcohol since admit 06/21/16   Drug use: No   Sexual activity: Not Currently    Birth control/protection: I.U.D.  Other  Topics Concern   Not on file  Social History Narrative   Not on file   Social Drivers of Health   Financial Resource Strain: Not on file  Food Insecurity: No Food Insecurity (07/01/2023)   Received from [facility]   Hunger Vital Sign    Worried About Running Out of Food in the Last Year: Never true    Ran Out of Food in the Last Year: Never true  Transportation Needs: No Transportation Needs (07/01/2023)   [facility] from [facility] - [facility]    Lack of Transportation (Medical): No    Lack of Transportation (Non-Medical): No  Physical Activity: Not on file  Stress: Not on file  Social Connections: Not on file    Allergies: No Known Allergies  Metabolic Disorder Labs: No results found for: "HGBA1C", "MPG" No results found for: "PROLACTIN" No results found for: "CHOL", "TRIG", "HDL", "CHOLHDL", "VLDL", "LDLCALC" Lab Results  Component Value Date   TSH 0.305 (L) 06/22/2016    Therapeutic Level Labs: No results found for: "LITHIUM" No results found for: "VALPROATE" No results found for: "CBMZ"  Current Medications: Current Outpatient Medications  Medication Sig Dispense Refill   buPROPion  (WELLBUTRIN  XL) 150 MG 24 hr tablet Take 1 tablet (150 mg total) by mouth every morning. 30 tablet 2   acetaminophen  (TYLENOL ) 500 MG tablet Take 500 mg by mouth every 6 (six) hours as needed for mild pain or moderate pain.     ALPRAZolam  (XANAX ) 1 MG tablet Take 1 tablet (1 mg total) by mouth 4 (four) times daily. 120 tablet 2   buPROPion  (WELLBUTRIN  XL) 300 MG 24 hr tablet Take 1 tablet (300 mg total) by mouth every morning. 30 tablet 2   ibuprofen  (ADVIL ,MOTRIN ) 600 MG tablet Take 1 tablet (600 mg total) by mouth every 6 (six) hours as needed. 30 tablet 0   mirtazapine  (REMERON ) 45 MG tablet Take 1 tablet (45 mg total) by mouth at bedtime. 30 tablet 2   ondansetron  (ZOFRAN -ODT) 4 MG disintegrating tablet Take 1 tablet (4 mg total) by mouth every 8 (eight) hours  as needed for nausea or vomiting. 8 tablet 0   zolpidem  (AMBIEN ) 10 MG tablet Take 1 tablet (10 mg total) by mouth at bedtime as needed for sleep. 30 tablet 2   No current facility-administered medications for this visit.     Musculoskeletal: Strength & Muscle Tone: within normal limits Gait & Station: normal Patient leans: N/A  Psychiatric Specialty Exam: Review of Systems  Constitutional:  Positive for fatigue.  Psychiatric/Behavioral:  Positive for dysphoric mood.   All other systems reviewed and are negative.   There were no vitals taken for this visit.There is no height or weight on file to calculate BMI.  General Appearance:  Casual, Neat, and Well Groomed  Eye Contact:  Good  Speech:  Clear and Coherent  Volume:  Normal  Mood:  Dysphoric  Affect:  Congruent  Thought Process:  Goal Directed  Orientation:  Full (Time, Place, and Person)  Thought Content: WDL   Suicidal Thoughts:  No  Homicidal Thoughts:  No  Memory:  Immediate;   Good Recent;   Good Remote;   Good  Judgement:  Good  Insight:  Fair  Psychomotor Activity:  Decreased  Concentration:  Concentration: Good and Attention Span: Good  Recall:  Good  Fund of Knowledge: Good  Language: Good  Akathisia:  No  Handed:  Right  AIMS (if indicated): not done  Assets:  Communication Skills Desire for Improvement Physical Health Resilience Social Support Talents/Skills  ADL's:  Intact  Cognition: WNL  Sleep:  Good   Screenings: PHQ2-9    Flowsheet Row Video Visit from 06/12/2022 in [facility] at [place] Video Visit from 02/24/2022 in [facility] at [place] Video Visit from 12/22/2021 in [facility] at [place] Video Visit from 09/26/2021 in [facility] Outpatient [facility] Health at [place] Video Visit from 05/01/2021 in [facility] at [facility] Total Score 0 1 0 1 1       Flowsheet Row Video Visit from 06/12/2022 in [facility] at [place] Video Visit from 02/24/2022 in [facility] at [place] Video Visit from 12/22/2021 in [facility] at [place]  C-SSRS RISK CATEGORY No Risk No Risk No Risk        Assessment and Plan: This patient is a 33 year old female with a history of PTSD and insomnia.  She is experiencing worsening depression given her grandmother's recent death and the deaths of other family members.  She will increase Wellbutrin  XL to 450 mg in the morning for depression, continue mirtazapine  45 mg at bedtime for sleep and anxiety and Xanax  1 mg 4 times daily for anxiety.  She rarely uses the Ambien  10 mg at bedtime for sleep.  She will return to see me in 2 months  Collaboration of Care: Collaboration of Care: Primary Care Provider AEB notes will be shared with PCP at patient's request  Patient/Guardian was advised Release of Information must be obtained prior to any record release in order to collaborate their care with an outside provider. Patient/Guardian was advised if they have not already done so to contact the registration department to sign all necessary forms in order for us  to release information regarding their care.   Consent: Patient/Guardian gives verbal consent for treatment and assignment of benefits for services provided during this visit. Patient/Guardian expressed understanding and agreed to proceed.    Alfredia Annas, MD 09/29/2023, 10:03 AM

## 2023-12-09 ENCOUNTER — Telehealth (INDEPENDENT_AMBULATORY_CARE_PROVIDER_SITE_OTHER): Admitting: Psychiatry

## 2023-12-09 ENCOUNTER — Encounter (HOSPITAL_COMMUNITY): Payer: Self-pay | Admitting: Psychiatry

## 2023-12-09 DIAGNOSIS — F4310 Post-traumatic stress disorder, unspecified: Secondary | ICD-10-CM

## 2023-12-09 MED ORDER — ZOLPIDEM TARTRATE 10 MG PO TABS: 10.0000 mg | ORAL_TABLET | Freq: Every evening | ORAL | 2 refills | Status: DC | PRN

## 2023-12-09 MED ORDER — ALPRAZOLAM 1 MG PO TABS: 1.0000 mg | ORAL_TABLET | Freq: Four times a day (QID) | ORAL | 2 refills | Status: DC

## 2023-12-09 MED ORDER — BUPROPION HCL ER (XL) 150 MG PO TB24
150.0000 mg | ORAL_TABLET | ORAL | 2 refills | Status: DC
Start: 2023-12-09 — End: 2024-02-24

## 2023-12-09 NOTE — Progress Notes (Signed)
 Virtual Visit via Video Note  I connected with Valerie Rose on 12/09/23 at  1:00 PM EDT by a video enabled telemedicine application and verified that I am speaking with the correct person using two identifiers.  Location: Patient: home Provider: office   I discussed the limitations of evaluation and management by telemedicine and the availability of in person appointments. The patient expressed understanding and agreed to proceed.     I discussed the assessment and treatment plan with the patient. The patient was provided an opportunity to ask questions and all were answered. The patient agreed with the plan and demonstrated an understanding of the instructions.   The patient was advised to call back or seek an in-person evaluation if the symptoms worsen or if the condition fails to improve as anticipated.  I provided 20 minutes of non-face-to-face time during this encounter.   [name], MD  [facility] MD/PA/NP OP Progress Note  12/09/2023 1:12 PM Valerie Rose  MRN:  [id number]  Chief Complaint:  Chief Complaint  Patient presents with   Depression   Anxiety   Follow-up   HPI: This patient is a 33 year old single white female lives with her son in [place].  She works for a [employer].  The patient returns for follow-up after 2 months regarding her posttraumatic stress disorder and anxiety.  Last time she was quite sad because her grandmother had recently passed away.  She is already lost her father and her son's father.  She was having symptoms of low energy depression and poor motivation.  I did increase her Wellbutrin XL from 300 mg to 450 mg daily.  She states after only 1 day on this higher dose that she felt very anxious and suicidal.  She claims she called here to let us know but I do not see any evidence of a message.  She states that she stopped all of her antidepressants including Wellbutrin and mirtazapine.  She also states that she has not been getting the  Ambien from the pharmacy for months and that looks like it has not been filled for almost 2 years.  Consequently she is not sleeping well.  The only thing she is taking right now is the Xanax.  The patient states since being off all antidepressants she is feeling more depressed has no energy or motivation.  She denies any suicidal thoughts now and stated that this only lasted for 1 day.  I suggested she go back to the Wellbutrin but at a much lower dose-150 mg and that we try to get the Ambien reinstated to help her sleep.  She voices agreement. Visit Diagnosis:    ICD-10-CM   1. PTSD (post-traumatic stress disorder)  F43.10       Past Psychiatric History: None  Past Medical History:  Past Medical History:  Diagnosis Date   Anxiety    Chronic daily headache    Depression    Ovarian cyst     Past Surgical History:  Procedure Laterality Date   CESAREAN SECTION     I & D EXTREMITY Left 03/04/2014   Procedure: IRRIGATION AND DEBRIDEMENT EXTREMITY, REPAIR OF MULTIPLE LACERATIONS, HIP AND KNEE;  Surgeon: Cammy Copa, MD;  Location: [facility] OR;  Service: Orthopedics;  Laterality: Left;  Wounds located at left hip and knee.   I & D EXTREMITY Left 03/06/2014   Procedure: IRRIGATION AND DEBRIDEMENT EXTREMITY WITH ABX BEAD PLACEMENT WITH DELAYED PRIMARY CLOSURE.;  Surgeon: Cammy Copa, MD;  Location:  [facility] OR;  Service: Orthopedics;  Laterality: Left;   LEG SURGERY      Family Psychiatric History: See below  Family History:  Family History  Problem Relation Age of Onset   Depression Mother    Anxiety disorder Mother    Post-traumatic stress disorder Sister    Alcohol abuse Maternal Grandmother    Depression Maternal Grandmother    Sudden Cardiac Death Neg Hx     Social History:  Social History   Socioeconomic History   Marital status: Single    Spouse name: Not on file   Number of children: Not on file   Years of education: Not on file   Highest education level: Not on file   Occupational History   Not on file  Tobacco Use   Smoking status: Former   Smokeless tobacco: Never  Vaping Use   Vaping status: Never Used  Substance and Sexual Activity   Alcohol use: No    Comment: no alcohol since admit 06/21/16   Drug use: No   Sexual activity: Not Currently    Birth control/protection: I.U.D.  Other Topics Concern   Not on file  Social History Narrative   Not on file   Social Drivers of Health   Financial Resource Strain: Not on file  Food Insecurity: No Food Insecurity (07/01/2023)   [facility]   Hunger Vital Sign    Worried About Running Out of Food in the Last Year: Never true    Ran Out of Food in the Last Year: Never true  Transportation Needs: No Transportation Needs (07/01/2023)   [facility] - [facility]    Lack of Transportation (Medical): No    Lack of Transportation (Non-Medical): No  Physical Activity: Not on file  Stress: Not on file  Social Connections: Not on file    Allergies: No Known Allergies  Metabolic Disorder Labs: No results found for: "HGBA1C", "MPG" No results found for: "PROLACTIN" No results found for: "CHOL", "TRIG", "HDL", "CHOLHDL", "VLDL", "LDLCALC" Lab Results  Component Value Date   TSH 0.305 (L) 06/22/2016    Therapeutic Level Labs: No results found for: "LITHIUM" No results found for: "VALPROATE" No results found for: "CBMZ"  Current Medications: Current Outpatient Medications  Medication Sig Dispense Refill   acetaminophen (TYLENOL) 500 MG tablet Take 500 mg by mouth every 6 (six) hours as needed for mild pain or moderate pain.     ALPRAZolam (XANAX) 1 MG tablet Take 1 tablet (1 mg total) by mouth 4 (four) times daily. 120 tablet 2   buPROPion (WELLBUTRIN XL) 150 MG 24 hr tablet Take 1 tablet (150 mg total) by mouth every morning. 30 tablet 2   ibuprofen (ADVIL,MOTRIN) 600 MG tablet Take 1 tablet (600 mg total) by mouth every 6 (six) hours as needed.  30 tablet 0   ondansetron (ZOFRAN-ODT) 4 MG disintegrating tablet Take 1 tablet (4 mg total) by mouth every 8 (eight) hours as needed for nausea or vomiting. 8 tablet 0   zolpidem (AMBIEN) 10 MG tablet Take 1 tablet (10 mg total) by mouth at bedtime as needed for sleep. 30 tablet 2   No current facility-administered medications for this visit.     Musculoskeletal: Strength & Muscle Tone: within normal limits Gait & Station: normal Patient leans: N/A  Psychiatric Specialty Exam: Review of Systems  Psychiatric/Behavioral:  Positive for dysphoric mood and sleep disturbance.   All other systems reviewed and are negative.  There were no vitals taken for this visit.There is no height or weight on file to calculate BMI.  General Appearance: Casual and Fairly Groomed  Eye Contact:  Good  Speech:  Clear and Coherent  Volume:  Normal  Mood:  Anxious and Depressed  Affect:  Congruent  Thought Process:  Goal Directed  Orientation:  Full (Time, Place, and Person)  Thought Content: WDL   Suicidal Thoughts:  No  Homicidal Thoughts:  No  Memory:  Immediate;   Good Recent;   Good Remote;   NA  Judgement:  Good  Insight:  Good  Psychomotor Activity:  Normal  Concentration:  Concentration: Good and Attention Span: Good  Recall:  Good  Fund of Knowledge: Good  Language: Good  Akathisia:  No  Handed:  Right  AIMS (if indicated): not done  Assets:  Communication Skills Desire for Improvement Physical Health Resilience Social Support  ADL's:  Intact  Cognition: WNL  Sleep:  Fair   Screenings: PHQ2-9    Flowsheet Row Video Visit from 06/12/2022 in [facility] at [place] Video Visit from 02/24/2022 in [facility] at [place] Video Visit from 12/22/2021 in [facility] at [place] Video Visit from 09/26/2021 in [facility] at [place] Video Visit from 05/01/2021 in [facility]patient [facility] at [facility] Total Score 0 1 0 1 1      Flowsheet Row Video Visit from 06/12/2022 in [facility] at [place] Video Visit from 02/24/2022 in [facility] at [place] Video Visit from 12/22/2021 in [facility] Outpatient Behavioral Health at [place]  C-SSRS RISK CATEGORY No Risk No Risk No Risk        Assessment and Plan: This patient is a 33 year old female with a history of PTSD and insomnia.  She is experiencing more depression now that she is totally off all antidepressants.  We will restart the Wellbutrin XL 1 at 150 mg daily.  She is to let me know immediately if she has adverse effects such as suicidal ideation.  She will continue Xanax 1 mg 4 times daily for anxiety.  We will try to reinstate the Ambien 10 mg at bedtime for sleep.  She will return to see me in 4 weeks  Collaboration of Care: Collaboration of Care: Primary Care Provider AEB notes will be shared with PCP at patient's request  Patient/Guardian was advised Release of Information must be obtained prior to any record release in order to collaborate their care with an outside provider. Patient/Guardian was advised if they have not already done so to contact the registration department to sign all necessary forms in order for [place] to release information regarding their care.   Consent: Patient/Guardian gives verbal consent for treatment and assignment of benefits for services provided during this visit. Patient/Guardian expressed understanding and agreed to proceed.    [name], MD 12/09/2023, 1:12 PM

## 2024-02-24 ENCOUNTER — Telehealth (HOSPITAL_COMMUNITY): Admitting: Psychiatry

## 2024-02-24 ENCOUNTER — Encounter (HOSPITAL_COMMUNITY): Payer: Self-pay | Admitting: Psychiatry

## 2024-02-24 DIAGNOSIS — F4310 Post-traumatic stress disorder, unspecified: Secondary | ICD-10-CM | POA: Diagnosis not present

## 2024-02-24 MED ORDER — ALPRAZOLAM 1 MG PO TABS: 1.0000 mg | ORAL_TABLET | Freq: Four times a day (QID) | ORAL | 2 refills | Status: DC

## 2024-02-24 MED ORDER — BUPROPION HCL ER (XL) 150 MG PO TB24: 150.0000 mg | ORAL_TABLET | ORAL | 2 refills | Status: DC

## 2024-02-24 MED ORDER — ZOLPIDEM TARTRATE 10 MG PO TABS: 10.0000 mg | ORAL_TABLET | Freq: Every evening | ORAL | 2 refills | Status: DC | PRN

## 2024-02-24 NOTE — Progress Notes (Signed)
 Virtual Visit via Video Note  I connected with Valerie Rose on 02/24/24 at  8:40 AM EDT by a video enabled telemedicine application and verified that I am speaking with the correct person using two identifiers.  Location: Patient: home Provider: office   I discussed the limitations of evaluation and management by telemedicine and the availability of in person appointments. The patient expressed understanding and agreed to proceed.     I discussed the assessment and treatment plan with the patient. The patient was provided an opportunity to ask questions and all were answered. The patient agreed with the plan and demonstrated an understanding of the instructions.   The patient was advised to call back or seek an in-person evaluation if the symptoms worsen or if the condition fails to improve as anticipated.  I provided 20 minutes of non-face-to-face time during this encounter.   Alfredia Annas, MD  [facility] MD/PA/NP OP Progress Note  02/24/2024 8:52 AM Valerie Rose  MRN:  [id number]  Chief Complaint:  Chief Complaint  Patient presents with   Anxiety   Depression   Follow-up   HPI: This patient is a 33 year old single white female who lives with her son in [place].  She is now working for a [employer].  The patient returns for follow-up after 3 months regarding her posttraumatic stress disorder and anxiety.  Prior to her last visit she had stopped her antidepressant but had become depressed again.  We restarted Wellbutrin  at lower dose-150 mg every morning.  She states she is feeling good on this dose and not significantly depressed or anxious.  She continues to take the Xanax  1 mg 4 times daily and I reminded her that at some point we are going to need to try to start cutting this back.  She uses Ambien  as needed for sleep.  I also reminded her not to combine this with the Xanax .  She is enjoying her new job in time with her son this summer. Visit Diagnosis:    ICD-10-CM   1.  PTSD (post-traumatic stress disorder)  F43.10       Past Psychiatric History: none  Past Medical History:  Past Medical History:  Diagnosis Date   Anxiety    Chronic daily headache    Depression    Ovarian cyst     Past Surgical History:  Procedure Laterality Date   CESAREAN SECTION     I & D EXTREMITY Left 03/04/2014   Procedure: IRRIGATION AND DEBRIDEMENT EXTREMITY, REPAIR OF MULTIPLE LACERATIONS, HIP AND KNEE;  Surgeon: Jasmine Mesi, MD;  Location: [facility] OR;  Service: Orthopedics;  Laterality: Left;  Wounds located at left hip and knee.   I & D EXTREMITY Left 03/06/2014   Procedure: IRRIGATION AND DEBRIDEMENT EXTREMITY WITH ABX BEAD PLACEMENT WITH DELAYED PRIMARY CLOSURE.;  Surgeon: Jasmine Mesi, MD;  Location: [facility] OR;  Service: Orthopedics;  Laterality: Left;   LEG SURGERY      Family Psychiatric History: See below  Family History:  Family History  Problem Relation Age of Onset   Depression Mother    Anxiety disorder Mother    Post-traumatic stress disorder Sister    Alcohol abuse Maternal Grandmother    Depression Maternal Grandmother    Sudden Cardiac Death Neg Hx     Social History:  Social History   Socioeconomic History   Marital status: Single    Spouse name: Not on file   Number of children: Not on file   Years of  education: Not on file   Highest education level: Not on file  Occupational History   Not on file  Tobacco Use   Smoking status: Former   Smokeless tobacco: Never  Vaping Use   Vaping status: Never Used  Substance and Sexual Activity   Alcohol use: No    Comment: no alcohol since admit 06/21/16   Drug use: No   Sexual activity: Not Currently    Birth control/protection: I.U.D.  Other Topics Concern   Not on file  Social History Narrative   Not on file   Social Drivers of Health   Financial Resource Strain: Not on file  Food Insecurity: No Food Insecurity (07/01/2023)   [facility]   Hunger Vital Sign     Worried About Running Out of Food in the Last Year: Never true    Ran Out of Food in the Last Year: Never true  Transportation Needs: No Transportation Needs (07/01/2023)   [facility] - [facility]    Lack of Transportation (Medical): No    Lack of Transportation (Non-Medical): No  Physical Activity: Not on file  Stress: Not on file  Social Connections: Not on file    Allergies: No Known Allergies  Metabolic Disorder Labs: No results found for: HGBA1C, MPG No results found for: PROLACTIN No results found for: CHOL, TRIG, HDL, CHOLHDL, VLDL, LDLCALC Lab Results  Component Value Date   TSH 0.305 (L) 06/22/2016    Therapeutic Level Labs: No results found for: LITHIUM No results found for: VALPROATE No results found for: CBMZ  Current Medications: Current Outpatient Medications  Medication Sig Dispense Refill   acetaminophen  (TYLENOL ) 500 MG tablet Take 500 mg by mouth every 6 (six) hours as needed for mild pain or moderate pain.     ALPRAZolam  (XANAX ) 1 MG tablet Take 1 tablet (1 mg total) by mouth 4 (four) times daily. 120 tablet 2   buPROPion  (WELLBUTRIN  XL) 150 MG 24 hr tablet Take 1 tablet (150 mg total) by mouth every morning. 30 tablet 2   ibuprofen  (ADVIL ,MOTRIN ) 600 MG tablet Take 1 tablet (600 mg total) by mouth every 6 (six) hours as needed. 30 tablet 0   ondansetron  (ZOFRAN -ODT) 4 MG disintegrating tablet Take 1 tablet (4 mg total) by mouth every 8 (eight) hours as needed for nausea or vomiting. 8 tablet 0   zolpidem  (AMBIEN ) 10 MG tablet Take 1 tablet (10 mg total) by mouth at bedtime as needed for sleep. 30 tablet 2   No current facility-administered medications for this visit.     Musculoskeletal: Strength & Muscle Tone: within normal limits Gait & Station: normal Patient leans: N/A  Psychiatric Specialty Exam: Review of Systems  All other systems reviewed and are negative.   There were no vitals  taken for this visit.There is no height or weight on file to calculate BMI.  General Appearance: Casual and Fairly Groomed  Eye Contact:  Good  Speech:  Clear and Coherent  Volume:  Normal  Mood:  Euthymic  Affect:  Congruent  Thought Process:  Goal Directed  Orientation:  Full (Time, Place, and Person)  Thought Content: WDL   Suicidal Thoughts:  No  Homicidal Thoughts:  No  Memory:  Immediate;   Good Recent;   Good Remote;   Good  Judgement:  Good  Insight:  Fair  Psychomotor Activity:  Normal  Concentration:  Concentration: Good and Attention Span: Good  Recall:  Good  Fund  of Knowledge: Good  Language: Good  Akathisia:  No  Handed:  Right  AIMS (if indicated): not done  Assets:  Communication Skills Desire for Improvement Physical Health Resilience Social Support Talents/Skills  ADL's:  Intact  Cognition: WNL  Sleep:  Good   Screenings: PHQ2-9    Flowsheet Row Video Visit from 06/12/2022 in [facility] at [place] Video Visit from 02/24/2022 in [facility] at [place] Video Visit from 12/22/2021 in [facility] at [place] Video Visit from 09/26/2021 in [facility] at [place] Video Visit from 05/01/2021 in [facility] at [facility] Total Score 0 1 0 1 1   Flowsheet Row Video Visit from 06/12/2022 in [facility] at [place] Video Visit from 02/24/2022 in [facility] at [place] Video Visit from 12/22/2021 in [facility] at [place]  C-SSRS RISK CATEGORY No Risk No Risk No Risk     Assessment and Plan: This patient is a 33 year old female with a history of PTSD and insomnia.  She is doing well on her current regimen.  She will continue Wellbutrin  XL 150 mg every morning for depression, Xanax  1 mg 4 times daily for anxiety and  Ambien  10 mg at bedtime for sleep.  She will return to see me in 3 months  Collaboration of Care: Collaboration of Care: Primary Care Provider AEB notes will be shared with PCP at patient's request  Patient/Guardian was advised Release of Information must be obtained prior to any record release in order to collaborate their care with an outside provider. Patient/Guardian was advised if they have not already done so to contact the registration department to sign all necessary forms in order for us  to release information regarding their care.   Consent: Patient/Guardian gives verbal consent for treatment and assignment of benefits for services provided during this visit. Patient/Guardian expressed understanding and agreed to proceed.    Alfredia Annas, MD 02/24/2024, 8:52 AM

## 2024-05-22 ENCOUNTER — Other Ambulatory Visit (HOSPITAL_COMMUNITY): Payer: Self-pay | Admitting: Psychiatry

## 2024-05-22 MED ORDER — ALPRAZOLAM 1 MG PO TABS: 1.0000 mg | ORAL_TABLET | Freq: Four times a day (QID) | ORAL | 0 refills | Status: DC

## 2024-05-22 NOTE — Telephone Encounter (Signed)
 Pt called in requesting refill on her Xanax  which was last filled 04/21/24, I called pharmacy she picked up medication 04/21/24 and there are no refills left. Pt scheduled 05/26/24. Please advise.

## 2024-05-22 NOTE — Telephone Encounter (Signed)
 Nevermind, I'll send in 7 days worth

## 2024-05-22 NOTE — Telephone Encounter (Signed)
 Med will be refilled at time of visit

## 2024-05-25 ENCOUNTER — Other Ambulatory Visit (HOSPITAL_COMMUNITY): Payer: Self-pay | Admitting: Psychiatry

## 2024-05-26 ENCOUNTER — Encounter (HOSPITAL_COMMUNITY): Payer: Self-pay | Admitting: Psychiatry

## 2024-05-26 ENCOUNTER — Telehealth (HOSPITAL_COMMUNITY): Admitting: Psychiatry

## 2024-05-26 DIAGNOSIS — F4310 Post-traumatic stress disorder, unspecified: Secondary | ICD-10-CM

## 2024-05-26 MED ORDER — ZOLPIDEM TARTRATE 10 MG PO TABS: 10.0000 mg | ORAL_TABLET | Freq: Every evening | ORAL | 2 refills | Status: DC | PRN

## 2024-05-26 MED ORDER — ALPRAZOLAM 1 MG PO TABS: 1.0000 mg | ORAL_TABLET | Freq: Four times a day (QID) | ORAL | 2 refills | Status: DC

## 2024-05-26 MED ORDER — ESCITALOPRAM OXALATE 20 MG PO TABS: 20.0000 mg | ORAL_TABLET | Freq: Every day | ORAL | 2 refills | Status: DC

## 2024-05-26 MED ORDER — BUPROPION HCL ER (XL) 150 MG PO TB24: 150.0000 mg | ORAL_TABLET | Freq: Every morning | ORAL | 2 refills | Status: DC

## 2024-05-26 NOTE — Progress Notes (Signed)
 Virtual Visit via Video Note  I connected with Valerie Rose on 05/26/24 at  9:00 AM EDT by a video enabled telemedicine application and verified that I am speaking with the correct person using two identifiers.  Location: Patient: home Provider: office   I discussed the limitations of evaluation and management by telemedicine and the availability of in person appointments. The patient expressed understanding and agreed to proceed.     I discussed the assessment and treatment plan with the patient. The patient was provided an opportunity to ask questions and all were answered. The patient agreed with the plan and demonstrated an understanding of the instructions.   The patient was advised to call back or seek an in-person evaluation if the symptoms worsen or if the condition fails to improve as anticipated.  I provided 20 minutes of non-face-to-face time during this encounter.   Barnie Gull, MD  [facility] MD/PA/NP OP Progress Note  05/26/2024 9:10 AM Valerie Rose  MRN:  [id number]  Chief Complaint:  Chief Complaint  Patient presents with   Anxiety   Depression   Follow-up   HPI: This patient is a 33 year old single white female who lives with her son in [place].  She is working for [employer].  The patient returns for follow-up after 3 months regarding posttraumatic stress disorder and anxiety.  She states she is doing okay but has been somewhat more depressed lately.  She is coming up on the anniversary of her son's father's death and her brother's death which both happened in 08-14-2024.  She is on the Wellbutrin  XL 150 mg in the morning.  She states that this helps to some degree but when she took the higher dose she felt disassociated.  She denies any thoughts of self-harm or suicide and is still functioning well with her family and at work.  I suggested that we add a low-dose of Lexapro  which will also help with anxiety and she is in agreement.  She is sleeping well with the  Ambien  and the Xanax  continues to help with the anxiety. Visit Diagnosis:    ICD-10-CM   1. PTSD (post-traumatic stress disorder)  F43.10       Past Psychiatric History: none  Past Medical History:  Past Medical History:  Diagnosis Date   Anxiety    Chronic daily headache    Depression    Ovarian cyst     Past Surgical History:  Procedure Laterality Date   CESAREAN SECTION     I & D EXTREMITY Left 03/04/2014   Procedure: IRRIGATION AND DEBRIDEMENT EXTREMITY, REPAIR OF MULTIPLE LACERATIONS, HIP AND KNEE;  Surgeon: Cordella Glendia Hutchinson, MD;  Location: [facility] OR;  Service: Orthopedics;  Laterality: Left;  Wounds located at left hip and knee.   I & D EXTREMITY Left 03/06/2014   Procedure: IRRIGATION AND DEBRIDEMENT EXTREMITY WITH ABX BEAD PLACEMENT WITH DELAYED PRIMARY CLOSURE.;  Surgeon: Cordella Glendia Hutchinson, MD;  Location: [facility] OR;  Service: Orthopedics;  Laterality: Left;   LEG SURGERY      Family Psychiatric History: See below  Family History:  Family History  Problem Relation Age of Onset   Depression Mother    Anxiety disorder Mother    Post-traumatic stress disorder Sister    Alcohol abuse Maternal Grandmother    Depression Maternal Grandmother    Sudden Cardiac Death Neg Hx     Social History:  Social History   Socioeconomic History   Marital status: Single    Spouse name: Not  on file   Number of children: Not on file   Years of education: Not on file   Highest education level: Not on file  Occupational History   Not on file  Tobacco Use   Smoking status: Former   Smokeless tobacco: Never  Vaping Use   Vaping status: Never Used  Substance and Sexual Activity   Alcohol use: No    Comment: no alcohol since admit 06/21/16   Drug use: No   Sexual activity: Not Currently    Birth control/protection: I.U.D.  Other Topics Concern   Not on file  Social History Narrative   Not on file   Social Drivers of Health   Financial Resource Strain: Not on file  Food  Insecurity: No Food Insecurity (07/01/2023)   [facility]   Hunger Vital Sign    Within the past 12 months, you worried that your food would run out before you got the money to buy more.: Never true    Within the past 12 months, the food you bought just didn't last and you didn't have money to get more.: Never true  Transportation Needs: No Transportation Needs (07/01/2023)   [facility] from [facility] - [facility]    Lack of Transportation (Medical): No    Lack of Transportation (Non-Medical): No  Physical Activity: Not on file  Stress: Not on file  Social Connections: Not on file    Allergies: No Known Allergies  Metabolic Disorder Labs: No results found for: HGBA1C, MPG No results found for: PROLACTIN No results found for: CHOL, TRIG, HDL, CHOLHDL, VLDL, LDLCALC Lab Results  Component Value Date   TSH 0.305 (L) 06/22/2016    Therapeutic Level Labs: No results found for: LITHIUM No results found for: VALPROATE No results found for: CBMZ  Current Medications: Current Outpatient Medications  Medication Sig Dispense Refill   escitalopram  (LEXAPRO ) 20 MG tablet Take 1 tablet (20 mg total) by mouth daily. 30 tablet 2   acetaminophen  (TYLENOL ) 500 MG tablet Take 500 mg by mouth every 6 (six) hours as needed for mild pain or moderate pain.     ALPRAZolam  (XANAX ) 1 MG tablet Take 1 tablet (1 mg total) by mouth 4 (four) times daily. 120 tablet 2   buPROPion  (WELLBUTRIN  XL) 150 MG 24 hr tablet Take 1 tablet (150 mg total) by mouth every morning. 30 tablet 2   ibuprofen  (ADVIL ,MOTRIN ) 600 MG tablet Take 1 tablet (600 mg total) by mouth every 6 (six) hours as needed. 30 tablet 0   ondansetron  (ZOFRAN -ODT) 4 MG disintegrating tablet Take 1 tablet (4 mg total) by mouth every 8 (eight) hours as needed for nausea or vomiting. 8 tablet 0   zolpidem  (AMBIEN ) 10 MG tablet Take 1 tablet (10 mg total) by mouth at bedtime as needed for  sleep. 30 tablet 2   No current facility-administered medications for this visit.     Musculoskeletal: Strength & Muscle Tone: within normal limits Gait & Station: normal Patient leans: N/A  Psychiatric Specialty Exam: Review of Systems  Psychiatric/Behavioral:  Positive for dysphoric mood.   All other systems reviewed and are negative.   There were no vitals taken for this visit.There is no height or weight on file to calculate BMI.  General Appearance: Casual and Fairly Groomed  Eye Contact:  Good  Speech:  Clear and Coherent  Volume:  Normal  Mood:  Dysphoric  Affect:  Congruent  Thought Process:  Goal Directed  Orientation:  Full (Time, Place, and Person)  Thought Content: WDL   Suicidal Thoughts:  No  Homicidal Thoughts:  No  Memory:  Immediate;   Good Recent;   Good Remote;   Good  Judgement:  Good  Insight:  Good  Psychomotor Activity:  Normal  Concentration:  Concentration: Good and Attention Span: Good  Recall:  Good  Fund of Knowledge: Good  Language: Good  Akathisia:  No  Handed:  Right  AIMS (if indicated): not done  Assets:  Communication Skills Desire for Improvement Physical Health Resilience Social Support Vocational/Educational  ADL's:  Intact  Cognition: WNL  Sleep:  Good   Screenings: PHQ2-9    Flowsheet Row Video Visit from 06/12/2022 in [facility] at [place] Video Visit from 02/24/2022 in [facility] at [place] Video Visit from 12/22/2021 in [facility] at [place] Video Visit from 09/26/2021 in [facility] at [place] Video Visit from 05/01/2021 in [facility] at [facility] Total Score 0 1 0 1 1   Flowsheet Row Video Visit from 06/12/2022 in [facility] at [place] Video Visit from 02/24/2022 in [facility] at [place] Video  Visit from 12/22/2021 in [facility] at [place]  C-SSRS RISK CATEGORY No Risk No Risk No Risk     Assessment and Plan: This patient is a 33 year old female with a history of PTSD and insomnia.  She is not doing quite as well lately in terms of mood.  We will continue Wellbutrin  XL 150 mg every morning but add Lexapro  20 mg daily as a second antidepressant.  She will continue Xanax  1 mg 4 times daily for anxiety and Ambien  10 mg at bedtime for sleep.  She will return to see me in 3 months  Collaboration of Care: Collaboration of Care: Primary Care Provider AEB notes will be shared with PCP at patient's request  Patient/Guardian was advised Release of Information must be obtained prior to any record release in order to collaborate their care with an outside provider. Patient/Guardian was advised if they have not already done so to contact the registration department to sign all necessary forms in order for us  to release information regarding their care.   Consent: Patient/Guardian gives verbal consent for treatment and assignment of benefits for services provided during this visit. Patient/Guardian expressed understanding and agreed to proceed.    Barnie Gull, MD 05/26/2024, 9:10 AM

## 2024-08-21 ENCOUNTER — Other Ambulatory Visit (HOSPITAL_COMMUNITY): Payer: Self-pay | Admitting: Psychiatry

## 2024-08-22 ENCOUNTER — Telehealth (HOSPITAL_COMMUNITY): Payer: Self-pay

## 2024-08-22 ENCOUNTER — Other Ambulatory Visit (HOSPITAL_COMMUNITY): Payer: Self-pay | Admitting: Psychiatry

## 2024-08-22 MED ORDER — ALPRAZOLAM 1 MG PO TABS: 1.0000 mg | ORAL_TABLET | Freq: Four times a day (QID) | ORAL | 2 refills | Status: DC

## 2024-08-22 NOTE — Telephone Encounter (Signed)
 [facility] pharmacy called in requesting a refill on pt's xanax  as yesterday it was denied as too soon but it was last filled on 07/24/24 and no refills left. Please advise.

## 2024-08-22 NOTE — Telephone Encounter (Signed)
 sent

## 2024-08-22 NOTE — Telephone Encounter (Signed)
 Pt aware.

## 2024-08-22 NOTE — Telephone Encounter (Signed)
 Please send rx to [facility] pharmacy

## 2024-08-22 NOTE — Telephone Encounter (Signed)
 resent

## 2024-08-25 ENCOUNTER — Encounter (HOSPITAL_COMMUNITY): Payer: Self-pay | Admitting: Psychiatry

## 2024-08-25 ENCOUNTER — Telehealth (INDEPENDENT_AMBULATORY_CARE_PROVIDER_SITE_OTHER): Admitting: Psychiatry

## 2024-08-25 DIAGNOSIS — F4310 Post-traumatic stress disorder, unspecified: Secondary | ICD-10-CM

## 2024-08-25 MED ORDER — BUPROPION HCL ER (XL) 150 MG PO TB24: 150.0000 mg | ORAL_TABLET | Freq: Every morning | ORAL | 2 refills | Status: AC

## 2024-08-25 MED ORDER — ALPRAZOLAM 1 MG PO TABS: 1.0000 mg | ORAL_TABLET | Freq: Four times a day (QID) | ORAL | 2 refills | Status: AC

## 2024-08-25 MED ORDER — ZOLPIDEM TARTRATE 10 MG PO TABS: 10.0000 mg | ORAL_TABLET | Freq: Every evening | ORAL | 2 refills | Status: AC | PRN

## 2024-08-25 NOTE — Progress Notes (Signed)
 Virtual Visit via Video Note  I connected with Valerie Rose on 08/25/2024 at  9:00 AM EST by a video enabled telemedicine application and verified that I am speaking with the correct person using two identifiers.  Location: Patient: home Provider: office   I discussed the limitations of evaluation and management by telemedicine and the availability of in person appointments. The patient expressed understanding and agreed to proceed.     I discussed the assessment and treatment plan with the patient. The patient was provided an opportunity to ask questions and all were answered. The patient agreed with the plan and demonstrated an understanding of the instructions.   The patient was advised to call back or seek an in-person evaluation if the symptoms worsen or if the condition fails to improve as anticipated.  I provided 20 minutes of non-face-to-face time during this encounter.   Barnie Gull, MD  [facility] MD/PA/NP OP Progress Note  08/25/2024 9:13 AM Valerie Rose  MRN:  [id number]  Chief Complaint:  Chief Complaint  Patient presents with   Anxiety   Depression   HPI: This patient is a 33 year old single white female who lives with her son in [place]. She is working for [employer].   Patient returns for follow-up after 3 months regarding posttraumatic stress disorder and generalized anxiety disorder.  Last time she was feeling a bit more depressed that she was coming up on the anniversary of her son's father's death and her brother's death both of which occurred in November.  We added Lexapro  to her regimen.  However she states it made her feel worse, more jittery and anxious so she stopped it she is still taking the Wellbutrin  XL 150 mg every morning and feels like this is working well for her depression.  The Xanax  continues to help her anxiety and she does not feel comfortable lowering the dose at this time.  She is sleeping well with the Ambien . Visit Diagnosis:     ICD-10-CM   1. PTSD (post-traumatic stress disorder)  F43.10       Past Psychiatric History: None  Past Medical History:  Past Medical History:  Diagnosis Date   Anxiety    Chronic daily headache    Depression    Ovarian cyst     Past Surgical History:  Procedure Laterality Date   CESAREAN SECTION     I & D EXTREMITY Left 03/04/2014   Procedure: IRRIGATION AND DEBRIDEMENT EXTREMITY, REPAIR OF MULTIPLE LACERATIONS, HIP AND KNEE;  Surgeon: Cordella Glendia Hutchinson, MD;  Location: [facility] OR;  Service: Orthopedics;  Laterality: Left;  Wounds located at left hip and knee.   I & D EXTREMITY Left 03/06/2014   Procedure: IRRIGATION AND DEBRIDEMENT EXTREMITY WITH ABX BEAD PLACEMENT WITH DELAYED PRIMARY CLOSURE.;  Surgeon: Cordella Glendia Hutchinson, MD;  Location: [facility] OR;  Service: Orthopedics;  Laterality: Left;   LEG SURGERY      Family Psychiatric History: See below  Family History:  Family History  Problem Relation Age of Onset   Depression Mother    Anxiety disorder Mother    Post-traumatic stress disorder Sister    Alcohol abuse Maternal Grandmother    Depression Maternal Grandmother    Sudden Cardiac Death Neg Hx     Social History:  Social History   Socioeconomic History   Marital status: Single    Spouse name: Not on file   Number of children: Not on file   Years of education: Not on file   Highest  education level: Not on file  Occupational History   Not on file  Tobacco Use   Smoking status: Former   Smokeless tobacco: Never  Vaping Use   Vaping status: Never Used  Substance and Sexual Activity   Alcohol use: No    Comment: no alcohol since admit 06/21/16   Drug use: No   Sexual activity: Not Currently    Birth control/protection: I.U.D.  Other Topics Concern   Not on file  Social History Narrative   Not on file   Social Drivers of Health   Tobacco Use: Low Risk (08/01/2024)   Received from [facility]   Patient History    Smoking Tobacco Use: Never    Smokeless  Tobacco Use: Never    Passive Exposure: Never  Recent Concern: Tobacco Use - Medium Risk (05/26/2024)   Patient History    Smoking Tobacco Use: Former    Smokeless Tobacco Use: Never    Passive Exposure: Not on Actuary Strain: Not on file  Food Insecurity: No Food Insecurity (08/01/2024)   Received from [facility]   Epic    Within the past 12 months, you worried that your food would run out before you got the money to buy more.: Never true    Within the past 12 months, the food you bought just didn't last and you didn't have money to get more.: Never true  Transportation Needs: No Transportation Needs (08/01/2024)   Received from [facility]   PRAPARE - Transportation    Lack of Transportation (Medical): No    Lack of Transportation (Non-Medical): No  Physical Activity: Not on file  Stress: Not on file  Social Connections: Not on file  Depression (PHQ2-9): Low Risk (06/12/2022)   Depression (PHQ2-9)    PHQ-2 Score: 0  Alcohol Screen: Not on file  Housing: Not on file  Utilities: Low Risk (08/01/2024)   Received from [facility]   Utilities    Within the past 12 months, have you been unable to get utilities(heat, electricity) when it was really needed?: No  Health Literacy: Low Risk (08/01/2024)   Received from [facility] Literacy    How often do you need to have someone help you when you read instructions, pamphlets, or other written material from your doctor or pharmacy?: Never    Allergies: Allergies[1]  Metabolic Disorder Labs: No results found for: HGBA1C, MPG No results found for: PROLACTIN No results found for: CHOL, TRIG, HDL, CHOLHDL, VLDL, LDLCALC Lab Results  Component Value Date   TSH 0.305 (L) 06/22/2016    Therapeutic Level Labs: No results found for: LITHIUM No results found for: VALPROATE No results found for: CBMZ  Current Medications: Current Outpatient Medications  Medication Sig  Dispense Refill   acetaminophen  (TYLENOL ) 500 MG tablet Take 500 mg by mouth every 6 (six) hours as needed for mild pain or moderate pain.     ALPRAZolam  (XANAX ) 1 MG tablet Take 1 tablet (1 mg total) by mouth 4 (four) times daily. 120 tablet 2   buPROPion  (WELLBUTRIN  XL) 150 MG 24 hr tablet Take 1 tablet (150 mg total) by mouth every morning. 30 tablet 2   ibuprofen  (ADVIL ,MOTRIN ) 600 MG tablet Take 1 tablet (600 mg total) by mouth every 6 (six) hours as needed. 30 tablet 0   ondansetron  (ZOFRAN -ODT) 4 MG disintegrating tablet Take 1 tablet (4 mg total) by mouth every 8 (eight) hours as needed for nausea or  vomiting. 8 tablet 0   zolpidem  (AMBIEN ) 10 MG tablet Take 1 tablet (10 mg total) by mouth at bedtime as needed for sleep. 30 tablet 2   No current facility-administered medications for this visit.     Musculoskeletal: Strength & Muscle Tone: within normal limits Gait & Station: normal Patient leans: N/A  Psychiatric Specialty Exam: Review of Systems  All other systems reviewed and are negative.   There were no vitals taken for this visit.There is no height or weight on file to calculate BMI.  General Appearance: Casual and Fairly Groomed  Eye Contact:  Good  Speech:  Clear and Coherent  Volume:  Normal  Mood:  Euthymic  Affect:  Congruent  Thought Process:  Goal Directed  Orientation:  Full (Time, Place, and Person)  Thought Content: Rumination   Suicidal Thoughts:  No  Homicidal Thoughts:  No  Memory:  Immediate;   Good Recent;   Good Remote;   Good  Judgement:  Good  Insight:  Good  Psychomotor Activity:  Normal  Concentration:  Concentration: Good and Attention Span: Good  Recall:  Good  Fund of Knowledge: Good  Language: Good  Akathisia:  No  Handed:  Right  AIMS (if indicated): not done  Assets:  Communication Skills Desire for Improvement Physical Health Resilience Social Support  ADL's:  Intact  Cognition: WNL  Sleep:  Good   Screenings: PHQ2-9     Flowsheet Row Video Visit from 06/12/2022 in [facility] [place] Video Visit from 02/24/2022 in [facility] [place] Video Visit from 12/22/2021 in [facility] at [place] Video Visit from 09/26/2021 in [facility] at [place] Video Visit from 05/01/2021 in [facility] at [facility] Total Score 0 1 0 1 1   Flowsheet Row Video Visit from 06/12/2022 in [facility] at [place] Video Visit from 02/24/2022 in [facility] at [place] Video Visit from 12/22/2021 in [facility] at [place]  C-SSRS RISK CATEGORY No Risk No Risk No Risk     Assessment and Plan: This patient is a 33 year old female with a history of posttraumatic stress disorder insomnia and generalized anxiety.  She is doing well on her current regimen.  She will continue Wellbutrin  XL 150 mg every morning for major depression, Xanax  1 mg 4 times daily for generalized anxiety and Ambien  10 mg at bedtime for sleep.  She will return to see me in 3 months  Collaboration of Care: Collaboration of Care: Primary Care Provider AEB notes will be shared with PCP at patient's request  Patient/Guardian was advised Release of Information must be obtained prior to any record release in order to collaborate their care with an outside provider. Patient/Guardian was advised if they have not already done so to contact the registration department to sign all necessary forms in order for us  to release information regarding their care.   Consent: Patient/Guardian gives verbal consent for treatment and assignment of benefits for services provided during this visit. Patient/Guardian expressed understanding and agreed to proceed.    Barnie Gull, MD 08/25/2024, 9:13 AM     [1] No Known Allergies

## 2024-08-28 ENCOUNTER — Telehealth (HOSPITAL_COMMUNITY): Payer: Self-pay | Admitting: *Deleted

## 2024-08-28 NOTE — Telephone Encounter (Signed)
 Patient states she is leaving for [place] this today to go to [place] and trying to fill her Ambien  and the pharmacy is not allowing her to fill early. Per pt she would like for provider to please give pharmacy the okay for her to fill 5 days early. Patient number is [phone]. Per pt she talked to provider about leaving during her previous visit. Pharmacy is [facility].

## 2024-08-28 NOTE — Telephone Encounter (Signed)
 Please tell pharmacy its okay

## 2024-09-04 NOTE — Telephone Encounter (Signed)
 Called pharmacy and informed pharmacist with what provider stated and she verbalized understanding

## 2024-09-12 ENCOUNTER — Encounter: Payer: Self-pay | Admitting: Neurology

## 2024-12-29 ENCOUNTER — Ambulatory Visit: Payer: Self-pay | Admitting: Neurology
